# Patient Record
Sex: Female | Born: 1980 | Race: Black or African American | Hispanic: No | Marital: Married | Smoking: Former smoker
Health system: Southern US, Community
[De-identification: ages and names within clinical notes are randomized; demographics above are authoritative.]

## PROBLEM LIST (undated history)

## (undated) DIAGNOSIS — E669 Obesity, unspecified: Secondary | ICD-10-CM

## (undated) DIAGNOSIS — D649 Anemia, unspecified: Secondary | ICD-10-CM

## (undated) DIAGNOSIS — F419 Anxiety disorder, unspecified: Secondary | ICD-10-CM

## (undated) DIAGNOSIS — R0602 Shortness of breath: Secondary | ICD-10-CM

## (undated) DIAGNOSIS — I10 Essential (primary) hypertension: Secondary | ICD-10-CM

## (undated) DIAGNOSIS — Z9289 Personal history of other medical treatment: Secondary | ICD-10-CM

## (undated) DIAGNOSIS — E039 Hypothyroidism, unspecified: Secondary | ICD-10-CM

## (undated) HISTORY — DX: Hypothyroidism, unspecified: E03.9

## (undated) HISTORY — DX: Essential (primary) hypertension: I10

## (undated) HISTORY — DX: Obesity, unspecified: E66.9

---

## 2000-11-07 ENCOUNTER — Other Ambulatory Visit: Admission: RE | Admit: 2000-11-07 | Discharge: 2000-11-07 | Payer: Self-pay | Admitting: Obstetrics

## 2001-02-23 ENCOUNTER — Inpatient Hospital Stay (HOSPITAL_COMMUNITY): Admission: AD | Admit: 2001-02-23 | Discharge: 2001-02-26 | Payer: Self-pay | Admitting: Obstetrics

## 2002-02-20 ENCOUNTER — Inpatient Hospital Stay (HOSPITAL_COMMUNITY): Admission: AD | Admit: 2002-02-20 | Discharge: 2002-02-23 | Payer: Self-pay | Admitting: Obstetrics

## 2003-06-13 ENCOUNTER — Inpatient Hospital Stay (HOSPITAL_COMMUNITY): Admission: AD | Admit: 2003-06-13 | Discharge: 2003-06-13 | Payer: Self-pay | Admitting: Obstetrics

## 2003-08-29 HISTORY — PX: TUBAL LIGATION: SHX77

## 2003-09-15 ENCOUNTER — Inpatient Hospital Stay (HOSPITAL_COMMUNITY): Admission: RE | Admit: 2003-09-15 | Discharge: 2003-09-18 | Payer: Self-pay | Admitting: Obstetrics

## 2003-09-15 ENCOUNTER — Encounter (INDEPENDENT_AMBULATORY_CARE_PROVIDER_SITE_OTHER): Payer: Self-pay | Admitting: Specialist

## 2004-12-25 ENCOUNTER — Inpatient Hospital Stay (HOSPITAL_COMMUNITY): Admission: EM | Admit: 2004-12-25 | Discharge: 2004-12-26 | Payer: Self-pay | Admitting: Emergency Medicine

## 2006-08-10 ENCOUNTER — Emergency Department (HOSPITAL_COMMUNITY): Admission: EM | Admit: 2006-08-10 | Discharge: 2006-08-10 | Payer: Self-pay | Admitting: Emergency Medicine

## 2007-05-05 ENCOUNTER — Emergency Department (HOSPITAL_COMMUNITY): Admission: EM | Admit: 2007-05-05 | Discharge: 2007-05-05 | Payer: Self-pay | Admitting: Emergency Medicine

## 2008-01-01 ENCOUNTER — Inpatient Hospital Stay (HOSPITAL_COMMUNITY): Admission: AD | Admit: 2008-01-01 | Discharge: 2008-01-01 | Payer: Self-pay | Admitting: Family Medicine

## 2008-01-17 ENCOUNTER — Encounter (INDEPENDENT_AMBULATORY_CARE_PROVIDER_SITE_OTHER): Payer: Self-pay | Admitting: Family Medicine

## 2008-01-17 ENCOUNTER — Ambulatory Visit: Payer: Self-pay | Admitting: Family Medicine

## 2008-01-17 DIAGNOSIS — E669 Obesity, unspecified: Secondary | ICD-10-CM | POA: Insufficient documentation

## 2008-01-17 LAB — CONVERTED CEMR LAB
ALT: 14 units/L (ref 0–35)
Albumin: 4.3 g/dL (ref 3.5–5.2)
CO2: 26 meq/L (ref 19–32)
Calcium: 9.4 mg/dL (ref 8.4–10.5)
Chloride: 103 meq/L (ref 96–112)
Cholesterol: 211 mg/dL — ABNORMAL HIGH (ref 0–200)
Creatinine, Ser: 0.92 mg/dL (ref 0.40–1.20)
GC Probe Amp, Genital: NEGATIVE
HCV Ab: NEGATIVE
Sodium: 138 meq/L (ref 135–145)
Total Protein: 7.5 g/dL (ref 6.0–8.3)

## 2008-01-21 ENCOUNTER — Encounter (INDEPENDENT_AMBULATORY_CARE_PROVIDER_SITE_OTHER): Payer: Self-pay | Admitting: Family Medicine

## 2008-01-27 ENCOUNTER — Encounter (INDEPENDENT_AMBULATORY_CARE_PROVIDER_SITE_OTHER): Payer: Self-pay | Admitting: Family Medicine

## 2008-02-17 ENCOUNTER — Ambulatory Visit: Payer: Self-pay | Admitting: Sports Medicine

## 2008-02-26 ENCOUNTER — Telehealth: Payer: Self-pay | Admitting: *Deleted

## 2008-02-27 ENCOUNTER — Encounter: Payer: Self-pay | Admitting: *Deleted

## 2008-03-19 ENCOUNTER — Ambulatory Visit: Payer: Self-pay | Admitting: Sports Medicine

## 2008-03-24 ENCOUNTER — Encounter: Payer: Self-pay | Admitting: Family Medicine

## 2008-03-24 ENCOUNTER — Ambulatory Visit: Payer: Self-pay | Admitting: Family Medicine

## 2008-03-24 LAB — CONVERTED CEMR LAB
Beta hcg, urine, semiquantitative: NEGATIVE
Ferritin: 1 ng/mL — ABNORMAL LOW (ref 10–291)
Hemoglobin: 7.5 g/dL
Platelets: 375 10*3/uL (ref 150–400)
RDW: 14.1 % (ref 11.5–15.5)

## 2008-03-25 ENCOUNTER — Ambulatory Visit (HOSPITAL_COMMUNITY): Admission: RE | Admit: 2008-03-25 | Discharge: 2008-03-25 | Payer: Self-pay | Admitting: Family Medicine

## 2008-03-25 ENCOUNTER — Telehealth: Payer: Self-pay | Admitting: *Deleted

## 2008-03-26 ENCOUNTER — Encounter: Payer: Self-pay | Admitting: Family Medicine

## 2008-03-27 ENCOUNTER — Encounter: Payer: Self-pay | Admitting: Family Medicine

## 2008-03-27 LAB — CONVERTED CEMR LAB: Free T4: 0.97 ng/dL (ref 0.89–1.80)

## 2008-04-03 ENCOUNTER — Encounter: Payer: Self-pay | Admitting: Family Medicine

## 2008-04-08 ENCOUNTER — Telehealth: Payer: Self-pay | Admitting: *Deleted

## 2008-04-23 ENCOUNTER — Ambulatory Visit: Payer: Self-pay | Admitting: Family Medicine

## 2008-04-23 DIAGNOSIS — E039 Hypothyroidism, unspecified: Secondary | ICD-10-CM | POA: Insufficient documentation

## 2008-06-18 ENCOUNTER — Encounter: Payer: Self-pay | Admitting: Family Medicine

## 2008-07-01 ENCOUNTER — Ambulatory Visit: Payer: Self-pay | Admitting: Family Medicine

## 2008-07-01 ENCOUNTER — Encounter: Payer: Self-pay | Admitting: Family Medicine

## 2008-07-01 LAB — CONVERTED CEMR LAB
Calcium: 9.4 mg/dL (ref 8.4–10.5)
Creatinine, Ser: 0.82 mg/dL (ref 0.40–1.20)
Glucose, Bld: 83 mg/dL (ref 70–99)
HCT: 35.8 % — ABNORMAL LOW (ref 36.0–46.0)
MCHC: 31.3 g/dL (ref 30.0–36.0)
MCV: 81.4 fL (ref 78.0–100.0)
Platelets: 387 10*3/uL (ref 150–400)
RBC: 4.4 M/uL (ref 3.87–5.11)
Sodium: 137 meq/L (ref 135–145)
TSH: 8.524 microintl units/mL — ABNORMAL HIGH (ref 0.350–4.50)

## 2008-08-10 ENCOUNTER — Ambulatory Visit: Payer: Self-pay | Admitting: Family Medicine

## 2008-08-10 ENCOUNTER — Encounter: Payer: Self-pay | Admitting: Family Medicine

## 2008-08-10 DIAGNOSIS — N938 Other specified abnormal uterine and vaginal bleeding: Secondary | ICD-10-CM | POA: Insufficient documentation

## 2008-08-10 DIAGNOSIS — N949 Unspecified condition associated with female genital organs and menstrual cycle: Secondary | ICD-10-CM

## 2008-08-10 DIAGNOSIS — I10 Essential (primary) hypertension: Secondary | ICD-10-CM | POA: Insufficient documentation

## 2008-08-10 LAB — CONVERTED CEMR LAB: TSH: 4.936 microintl units/mL — ABNORMAL HIGH (ref 0.350–4.50)

## 2008-09-17 ENCOUNTER — Encounter: Payer: Self-pay | Admitting: Family Medicine

## 2008-09-17 ENCOUNTER — Ambulatory Visit (HOSPITAL_COMMUNITY): Admission: RE | Admit: 2008-09-17 | Discharge: 2008-09-17 | Payer: Self-pay | Admitting: Family Medicine

## 2008-09-17 ENCOUNTER — Ambulatory Visit: Payer: Self-pay | Admitting: Family Medicine

## 2008-09-17 LAB — CONVERTED CEMR LAB
BUN: 10 mg/dL (ref 6–23)
CO2: 24 meq/L (ref 19–32)
Calcium: 9.3 mg/dL (ref 8.4–10.5)
Creatinine, Ser: 0.73 mg/dL (ref 0.40–1.20)

## 2008-09-18 ENCOUNTER — Telehealth: Payer: Self-pay | Admitting: Family Medicine

## 2008-09-25 ENCOUNTER — Encounter: Payer: Self-pay | Admitting: Family Medicine

## 2008-10-05 ENCOUNTER — Ambulatory Visit: Payer: Self-pay | Admitting: Family Medicine

## 2008-10-07 ENCOUNTER — Ambulatory Visit: Payer: Self-pay | Admitting: Family Medicine

## 2008-10-28 ENCOUNTER — Emergency Department (HOSPITAL_COMMUNITY): Admission: EM | Admit: 2008-10-28 | Discharge: 2008-10-28 | Payer: Self-pay | Admitting: Emergency Medicine

## 2008-11-10 ENCOUNTER — Encounter: Payer: Self-pay | Admitting: Family Medicine

## 2008-11-12 ENCOUNTER — Encounter: Admission: RE | Admit: 2008-11-12 | Discharge: 2008-12-16 | Payer: Self-pay | Admitting: Orthopedic Surgery

## 2009-01-13 IMAGING — US US TRANSVAGINAL NON-OB
1 series · 14 of 25 positions shown · non-contrast
Comparison: None

CLINICAL DATA: Uterine bleeding for 3 weeks

TRANSVAGINAL ULTRASOUND OF PELVIS,ULTRASOUND PELVIS COMPLETE -
MODIFY
TECHNIQUE: Transvaginal ultrasound examination of the pelvis was
performed including evaluation of the uterus, ovaries, adnexal
regions, and pelvic cul-de-sac,

[Series 1: us transvaginal non-ob · 0.24mm/px · 14 of 46 slices shown]
[im 1/46]
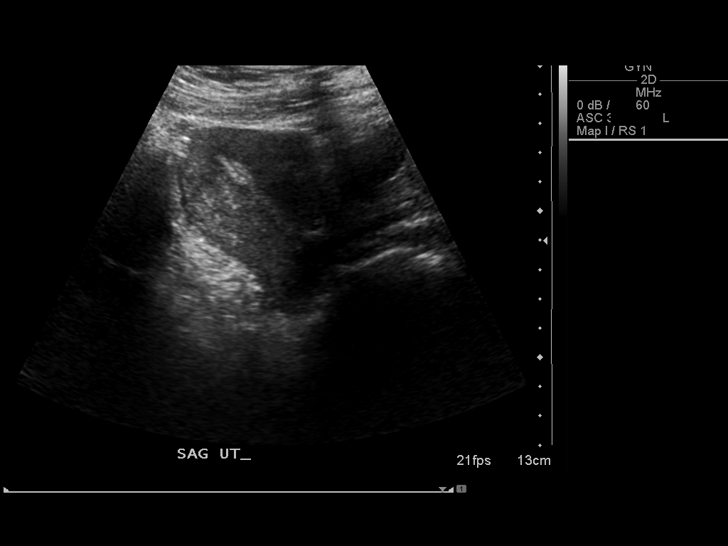
[im 4/46]
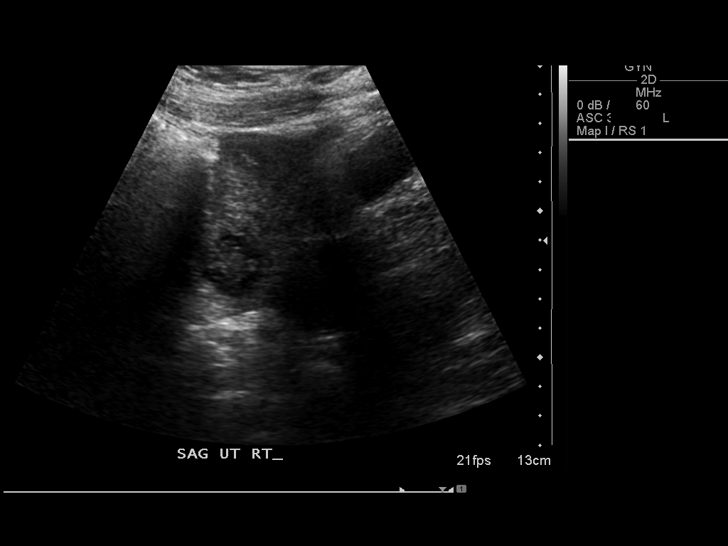
[im 8/46]
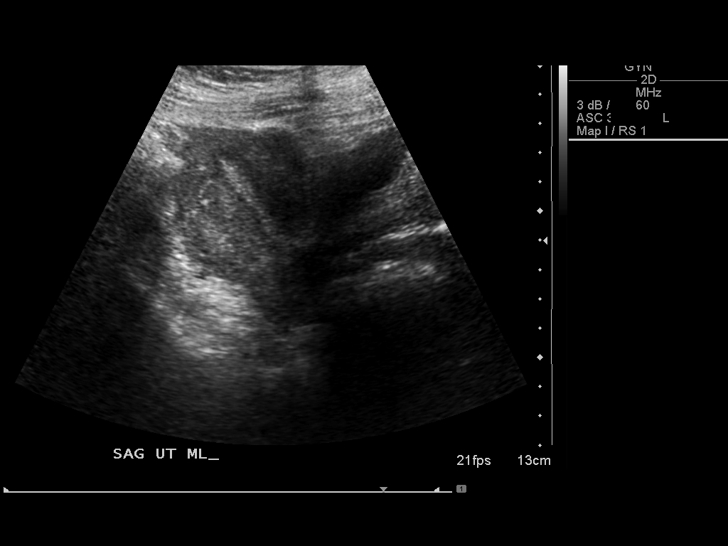
[im 12/46]
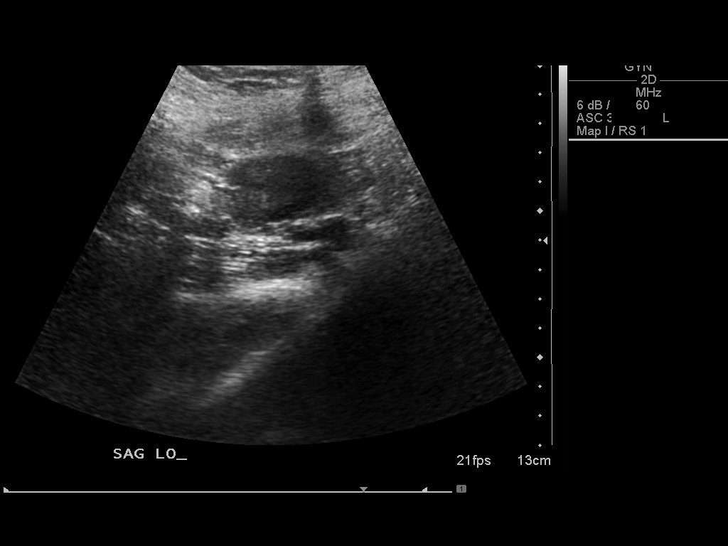
[im 16/46]
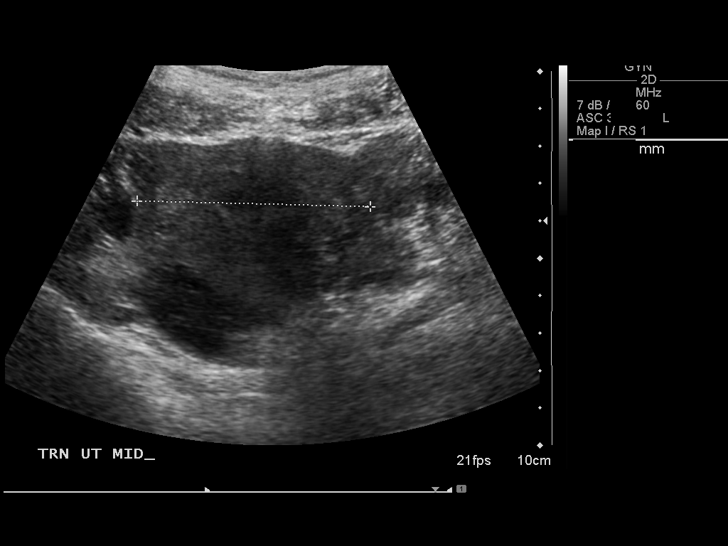
[im 17/46]
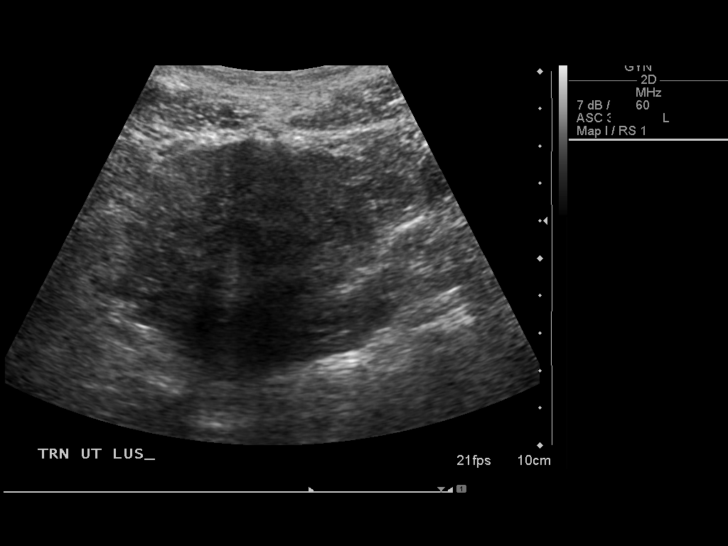
[im 21/46]
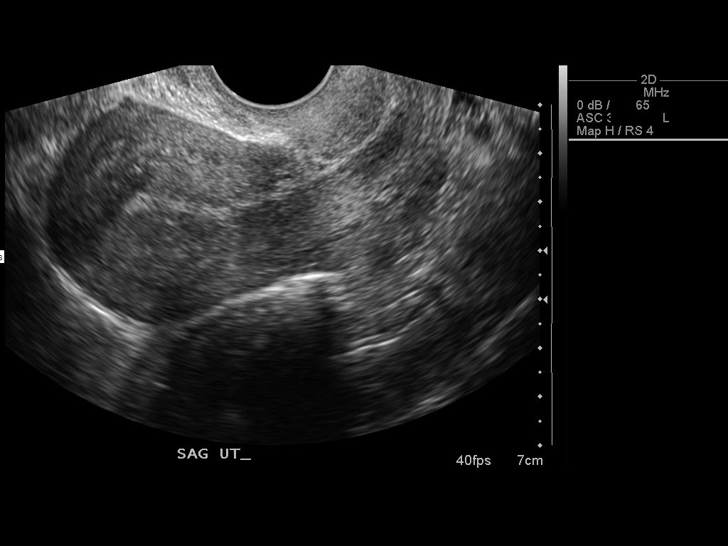
[im 25/46]
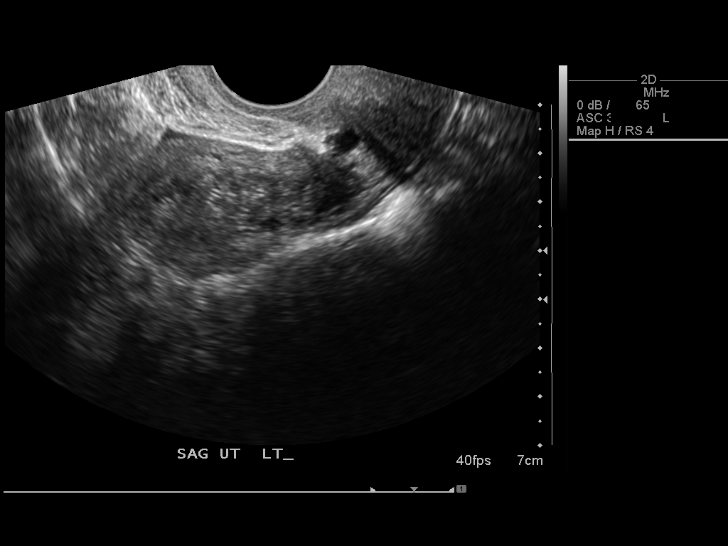
[im 29/46]
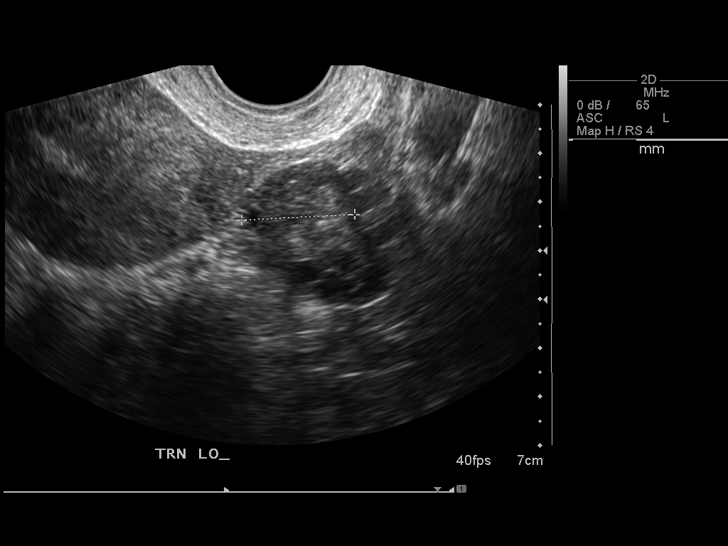
[im 31/46]
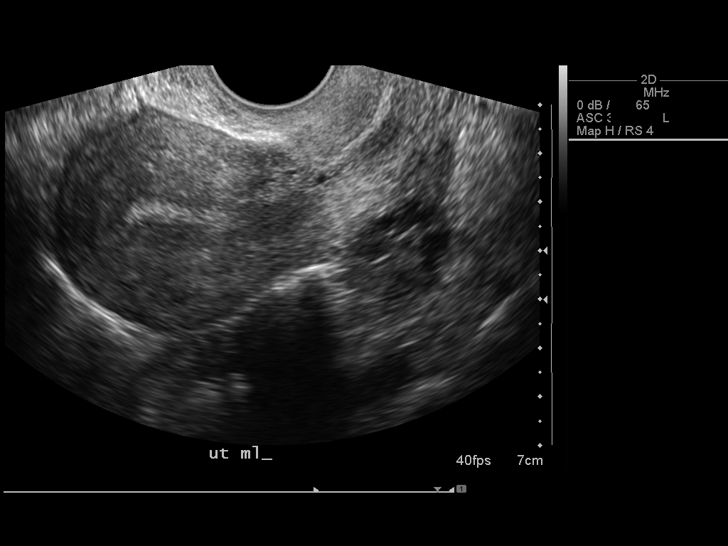
[im 34/46]
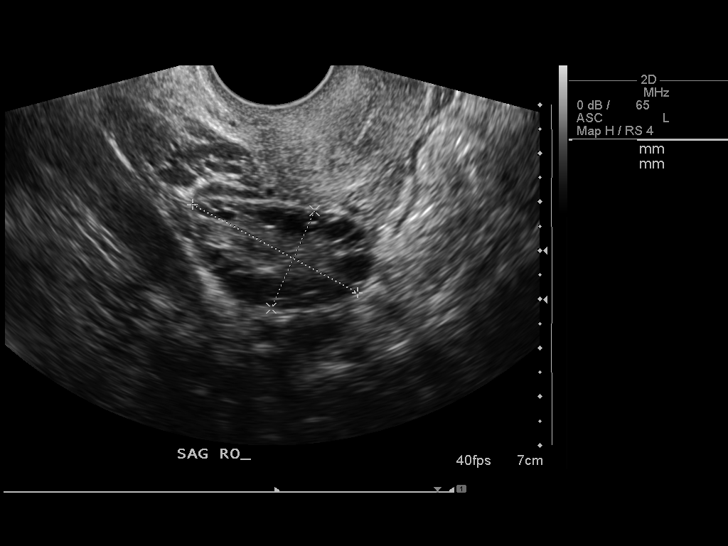
[im 38/46]
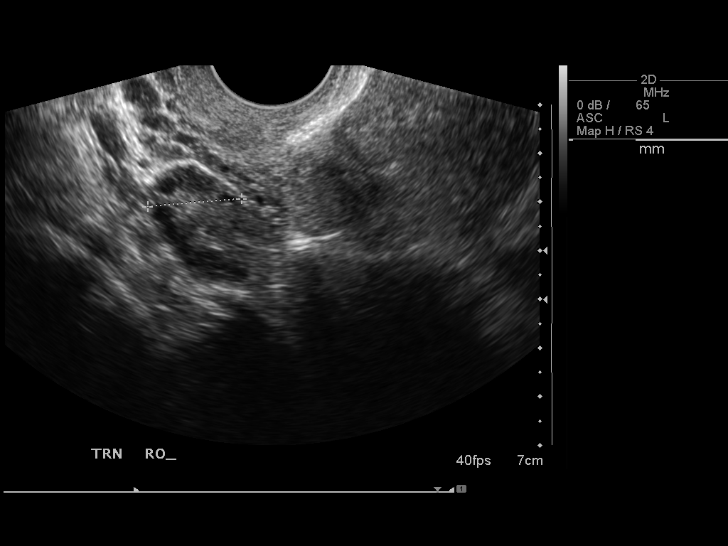
[im 42/46]
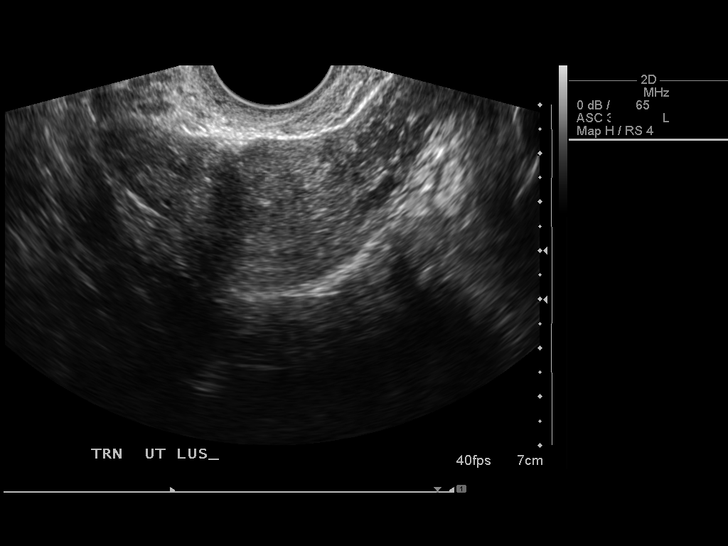
[im 46/46]
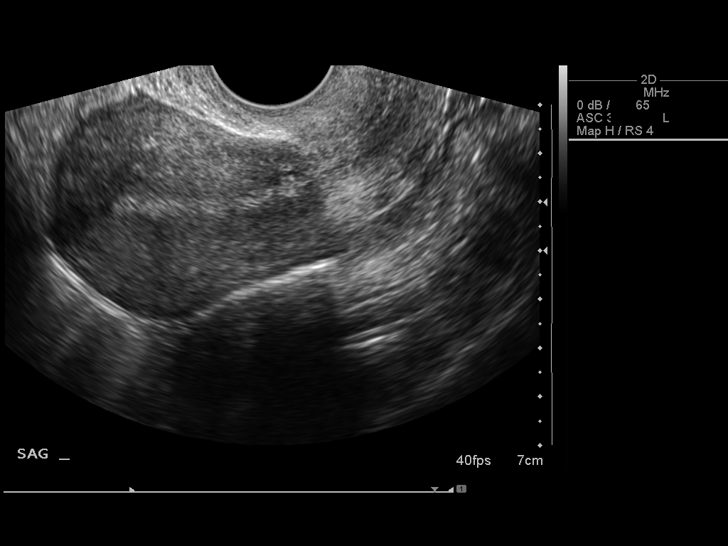

[14 of 25 positions shown; findings below may reference images not displayed]

FINDINGS: The uterus is normal in size and and is homogeneous in
echotexture, measuring 7.5 cm in length by 4.7 cm AP diameter by
6.2 cm in width.  No focal uterine mass is identified.  The
endometrium is 4 mm in thickness.

Both ovaries are normal in size in appearance measuring 3.8 x 2.2 x
1.9 cm on the right and 3.5 x 2.3 x 2.3 cm on the left.  There are
multiple small follicles in both ovaries.  No ovarian cyst or mass
is identified.  There is no free fluid in the pelvis.
IMPRESSION: The uterus and ovaries are within normal limits.  The endometrial
stripe is 4 mm in thickness.

## 2009-02-16 ENCOUNTER — Telehealth: Payer: Self-pay | Admitting: Family Medicine

## 2009-02-16 ENCOUNTER — Ambulatory Visit: Payer: Self-pay | Admitting: Family Medicine

## 2009-05-27 ENCOUNTER — Emergency Department (HOSPITAL_COMMUNITY): Admission: EM | Admit: 2009-05-27 | Discharge: 2009-05-27 | Payer: Self-pay | Admitting: Emergency Medicine

## 2009-06-07 ENCOUNTER — Encounter: Payer: Self-pay | Admitting: Family Medicine

## 2009-06-07 ENCOUNTER — Ambulatory Visit: Payer: Self-pay | Admitting: Family Medicine

## 2009-06-07 ENCOUNTER — Other Ambulatory Visit: Admission: RE | Admit: 2009-06-07 | Discharge: 2009-06-07 | Payer: Self-pay | Admitting: Family Medicine

## 2009-06-07 LAB — CONVERTED CEMR LAB
BUN: 10 mg/dL (ref 6–23)
Calcium: 8.8 mg/dL (ref 8.4–10.5)
Chlamydia, DNA Probe: NEGATIVE
Chloride: 104 meq/L (ref 96–112)
Creatinine, Ser: 1.08 mg/dL (ref 0.40–1.20)
GC Probe Amp, Genital: NEGATIVE
TSH: 9.433 microintl units/mL — ABNORMAL HIGH (ref 0.350–4.500)

## 2009-06-08 ENCOUNTER — Encounter: Payer: Self-pay | Admitting: Family Medicine

## 2009-06-09 ENCOUNTER — Telehealth: Payer: Self-pay | Admitting: Family Medicine

## 2009-06-22 ENCOUNTER — Emergency Department (HOSPITAL_COMMUNITY): Admission: EM | Admit: 2009-06-22 | Discharge: 2009-06-22 | Payer: Self-pay | Admitting: Emergency Medicine

## 2009-06-22 ENCOUNTER — Emergency Department (HOSPITAL_COMMUNITY): Admission: EM | Admit: 2009-06-22 | Discharge: 2009-06-22 | Payer: Self-pay | Admitting: Family Medicine

## 2009-06-22 ENCOUNTER — Encounter: Payer: Self-pay | Admitting: Family Medicine

## 2009-07-06 ENCOUNTER — Encounter: Payer: Self-pay | Admitting: Family Medicine

## 2009-07-06 ENCOUNTER — Encounter: Admission: RE | Admit: 2009-07-06 | Discharge: 2009-08-25 | Payer: Self-pay | Admitting: Family Medicine

## 2009-07-07 ENCOUNTER — Ambulatory Visit: Payer: Self-pay | Admitting: Family Medicine

## 2009-07-30 ENCOUNTER — Encounter: Payer: Self-pay | Admitting: Family Medicine

## 2009-07-30 ENCOUNTER — Ambulatory Visit: Payer: Self-pay | Admitting: Family Medicine

## 2009-08-02 LAB — CONVERTED CEMR LAB: TSH: 3.626 microintl units/mL (ref 0.350–4.500)

## 2009-10-06 ENCOUNTER — Ambulatory Visit: Payer: Self-pay | Admitting: Family Medicine

## 2009-10-06 ENCOUNTER — Telehealth: Payer: Self-pay | Admitting: Family Medicine

## 2009-11-01 ENCOUNTER — Ambulatory Visit: Payer: Self-pay | Admitting: Family Medicine

## 2009-11-03 ENCOUNTER — Ambulatory Visit: Payer: Self-pay | Admitting: Family Medicine

## 2009-11-03 ENCOUNTER — Encounter: Payer: Self-pay | Admitting: Family Medicine

## 2009-11-10 IMAGING — CR DG FINGER MIDDLE 2+V*L*
3 series · 3 of 3 positions shown · non-contrast
Comparison: None.

CLINICAL DATA: Slammed in door.

LEFT MIDDLE FINGER 2+V

[x finger pa left]
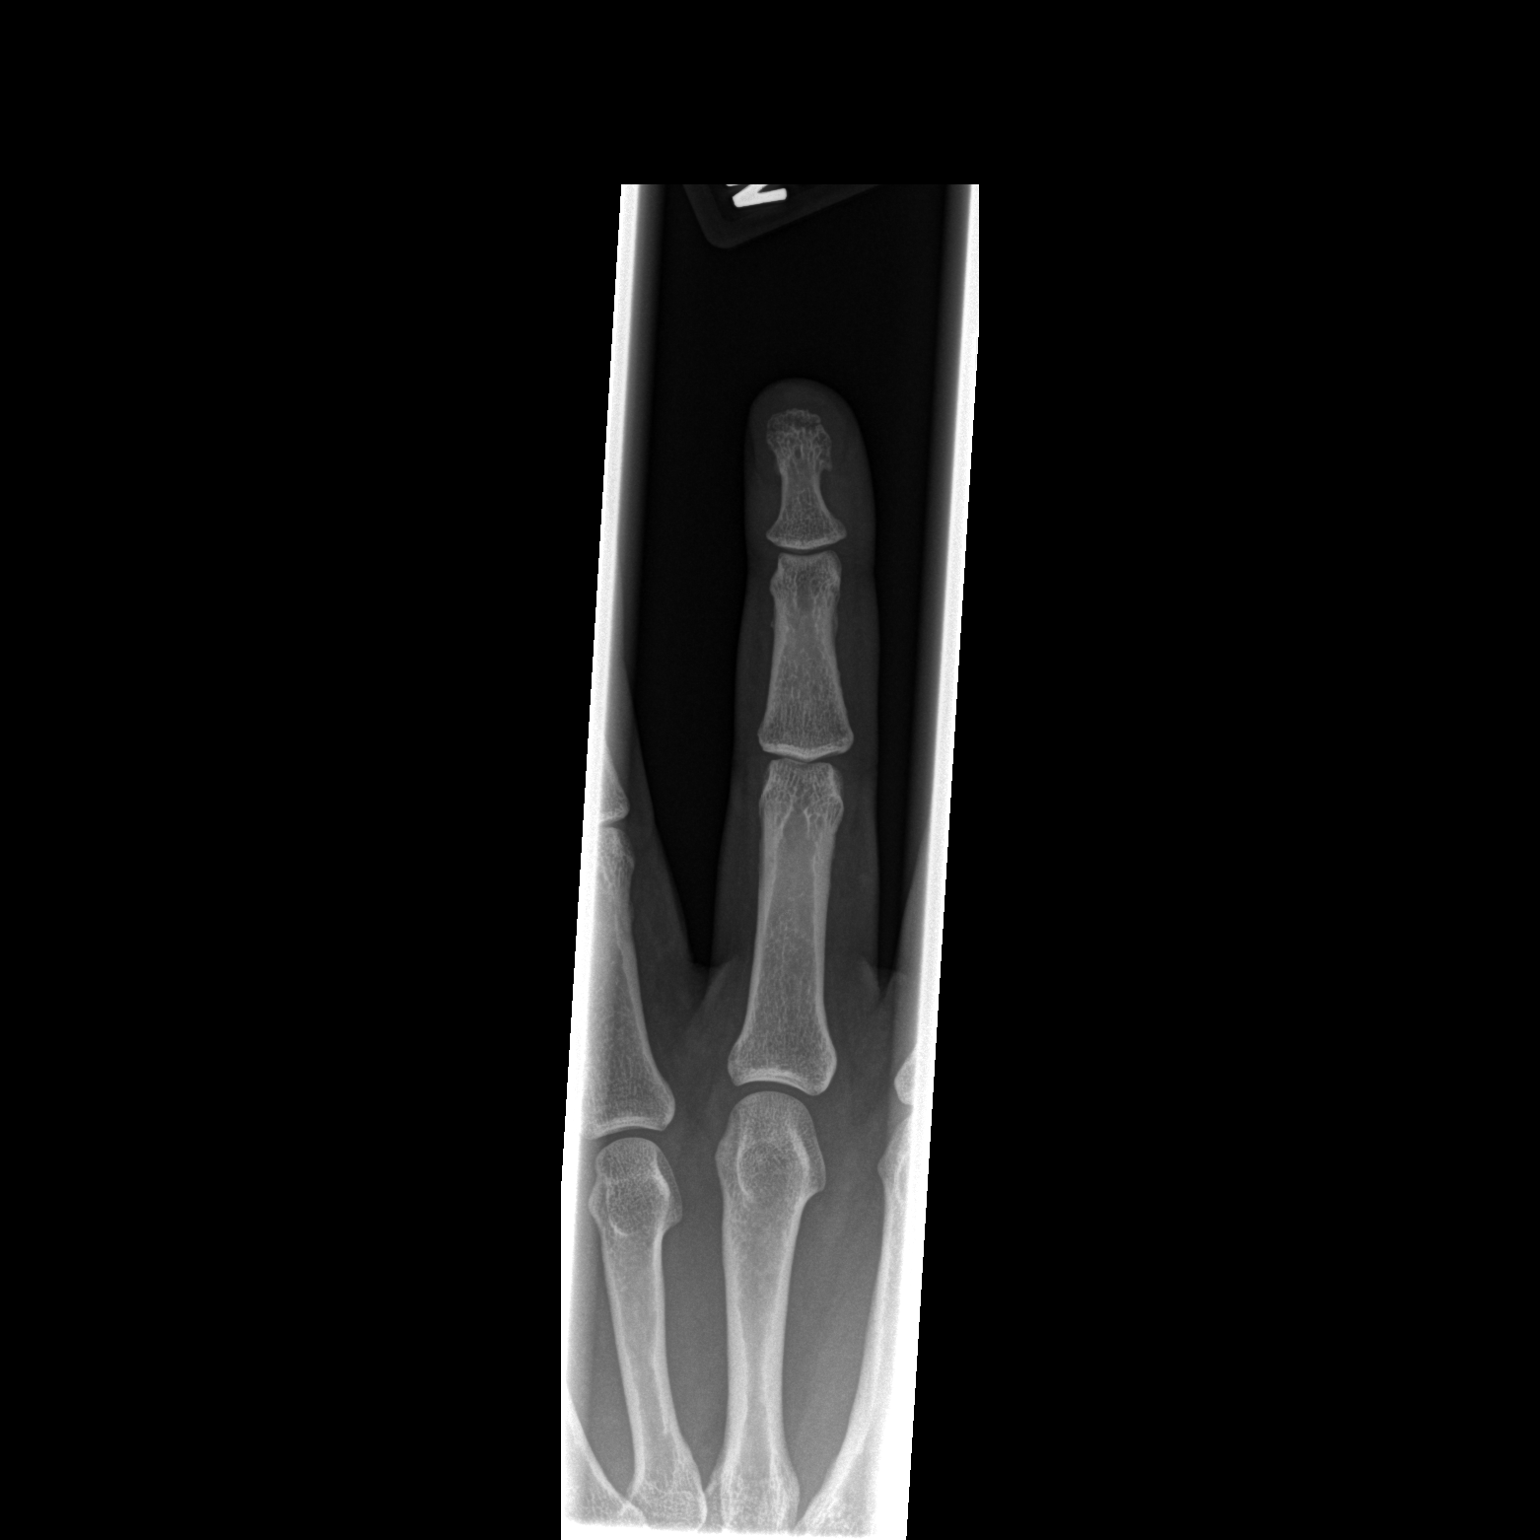

[x finger obl. left]
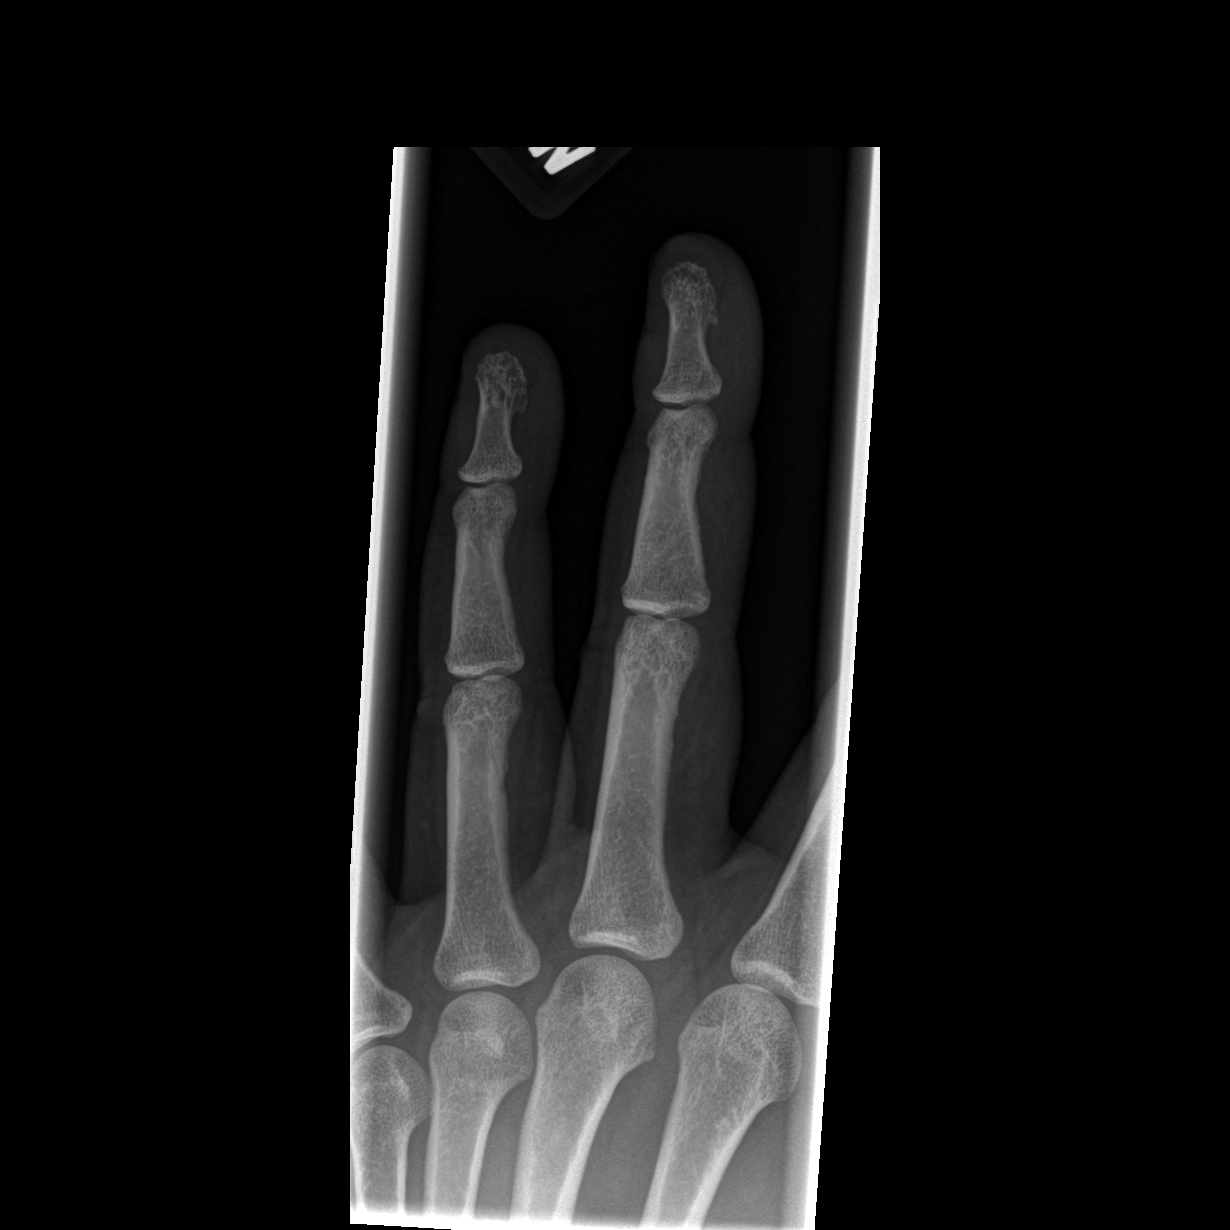

[x finger lateral left]
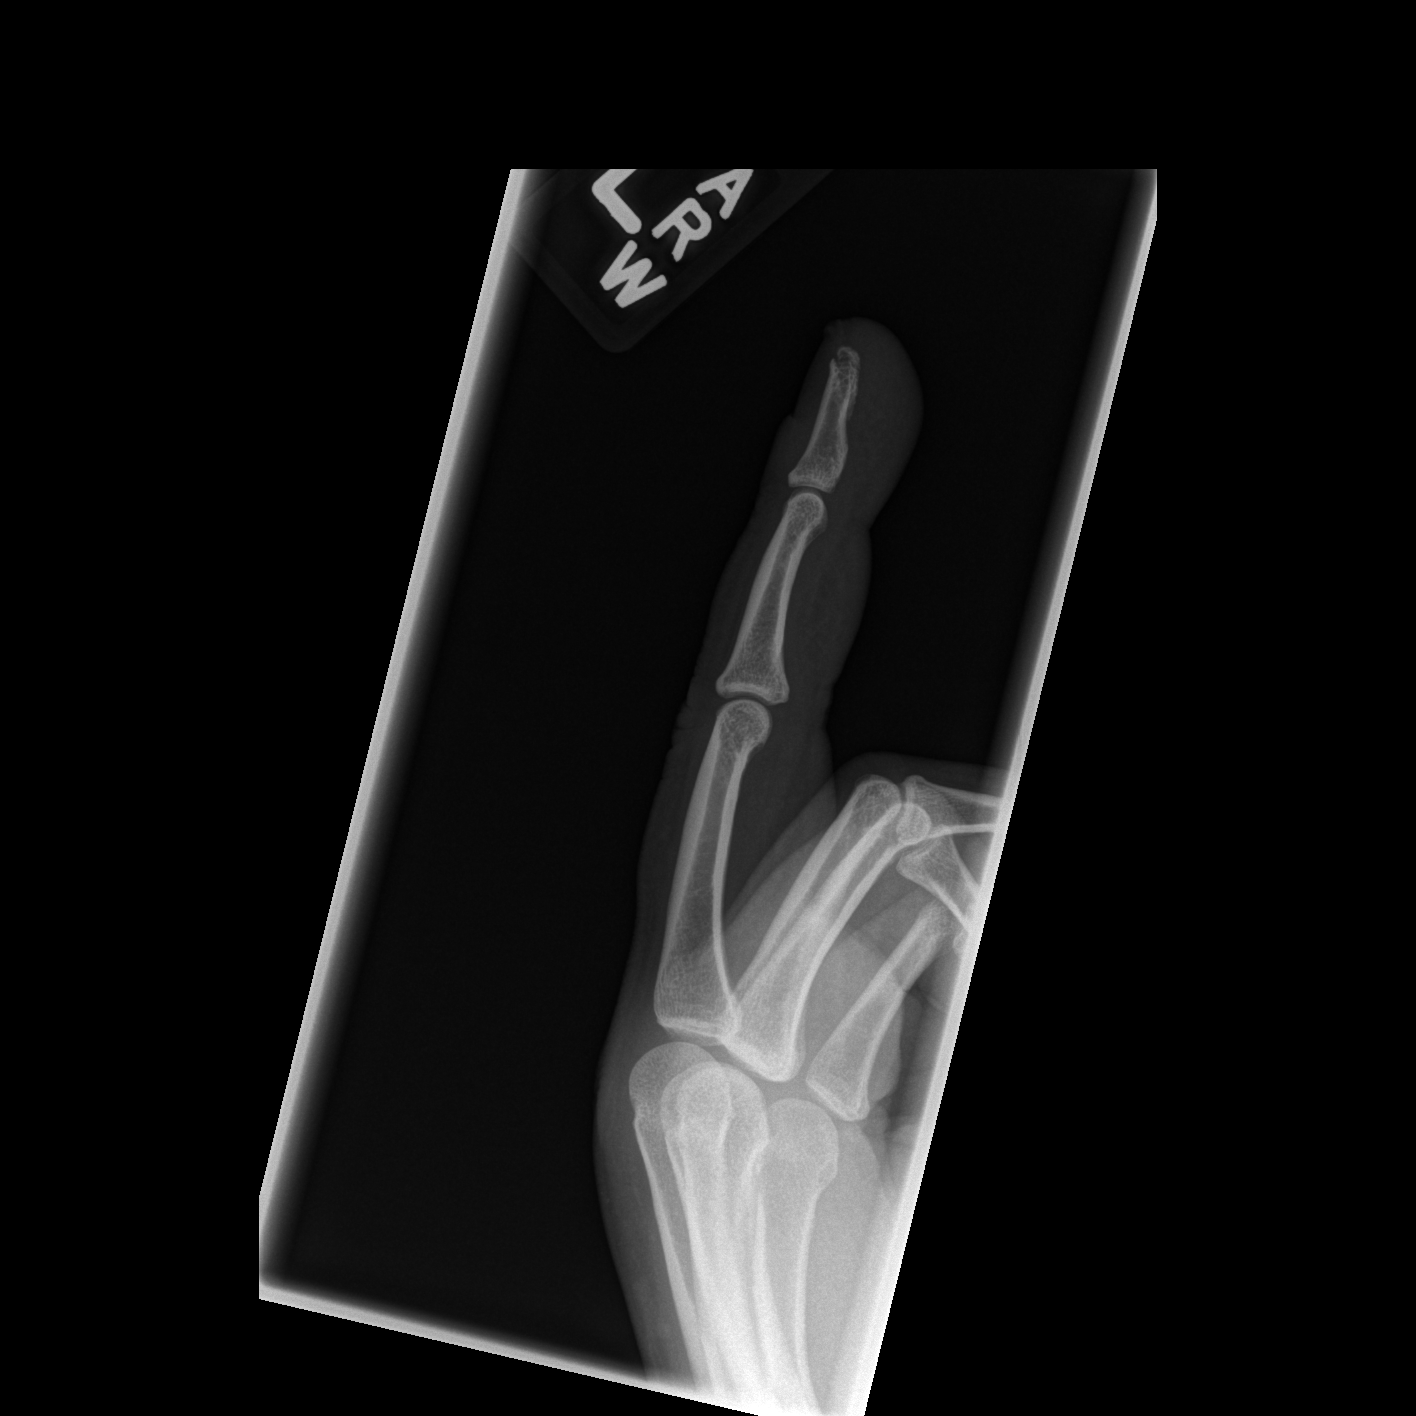

[3 of 3 positions shown; findings below may reference images not displayed]

FINDINGS: Minimally displaced fracture of the left middle finger
distal phalangeal tuft.
IMPRESSION: Fracture tuft of the left middle finger distal phalanx.

## 2009-11-19 ENCOUNTER — Ambulatory Visit: Payer: Self-pay | Admitting: Family Medicine

## 2010-03-15 ENCOUNTER — Ambulatory Visit: Payer: Self-pay | Admitting: Family Medicine

## 2010-03-15 ENCOUNTER — Encounter: Payer: Self-pay | Admitting: Family Medicine

## 2010-03-15 DIAGNOSIS — K5900 Constipation, unspecified: Secondary | ICD-10-CM | POA: Insufficient documentation

## 2010-03-16 ENCOUNTER — Encounter: Payer: Self-pay | Admitting: Family Medicine

## 2010-05-27 ENCOUNTER — Encounter: Payer: Self-pay | Admitting: Family Medicine

## 2010-08-28 DIAGNOSIS — F419 Anxiety disorder, unspecified: Secondary | ICD-10-CM

## 2010-08-28 HISTORY — DX: Anxiety disorder, unspecified: F41.9

## 2010-09-27 NOTE — Assessment & Plan Note (Signed)
Summary: cough,n&v, sore throat/[place]/Ta   Vital Signs:  Patient profile:   30 year old female Height:      67 inches Weight:      204.6 pounds BMI:     32.16 Temp:     98.6 degrees F oral Pulse rate:   86 / minute BP sitting:   138 / 89  (right arm) Cuff size:   regular  Vitals Entered By: Gladstone Pih (October 06, 2009 1:41 PM) CC: C/O cough,N/V, thraot sore X 2 days Is Patient Diabetic? No Pain Assessment Patient in pain? no        Primary Care Provider:  CAT TA MD  CC:  C/O cough, N/V, and thraot sore X 2 days.  History of Present Illness: 1.  cough, n/v--coughing and sore throat for past 2 days.  also headache.  also had 2 episodes of emesis after eating.  no fever, nasal congestion, diarrhea.  children sick with similar symptoms.  symptoms worse at night.  took nyquil with minimal relief.    Habits & Providers  Alcohol-Tobacco-Diet     Tobacco Status: never  Current Medications (verified): 1)  Lisinopril-Hydrochlorothiazide 20-25 Mg Tabs (Lisinopril-Hydrochlorothiazide) .Marland Kitchen.. 1 Tab By Mouth Daily For High Blood Pressure 2)  Levothroid 137 Mcg Tabs (Levothyroxine Sodium) .Marland Kitchen.. 1 Tab By Mouth Daily For Thyroid.  Take On An Empty Stomach.  Make Appt To Have Thyroid Checked in March 2011. 3)  Loratadine 10 Mg Tabs (Loratadine) .Marland Kitchen.. 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  no exudate Neck:  mild ant lad Lungs:  Normal respiratory effort, chest expands symmetrically. Lungs are clear to auscultation, no crackles or wheezes. Heart:  Normal rate and regular rhythm. S1 and S2 normal without gallop, murmur, click, rub  or other extra sounds. Additional Exam:  vital signs reviewed    Impression & Recommendations:  Problem # 1:  UPPER RESPIRATORY INFECTION (ICD-465.9) Assessment New  no red flags.  supportive care as below Her updated medication list for this problem includes:    Loratadine 10 Mg Tabs (Loratadine) .Marland Kitchen... 1 tab by mouth daily for allergies    Benzonatate 200 Mg Caps (Benzonatate) .Marland Kitchen... 1 tab by mouth three times a day as needed cough  Orders: [facility]- Est Level  3 ([address])  Complete Medication List: 1)  Lisinopril-hydrochlorothiazide 20-25 Mg Tabs (Lisinopril-hydrochlorothiazide) .Marland Kitchen.. 1 tab by mouth daily for high blood pressure 2)  Levothroid 137 Mcg Tabs (Levothyroxine sodium) .Marland Kitchen.. 1 tab by mouth daily for thyroid.  take on an empty stomach.  make appt to have thyroid checked in march 2011. 3)  Loratadine 10 Mg Tabs (Loratadine) .Marland Kitchen.. 1 tab by mouth daily for allergies 4)  Benzonatate 200 Mg Caps (Benzonatate) .Marland Kitchen.. 1 tab by mouth three times a day as needed cough  Patient Instructions: 1)  It was nice to see you today. 2)  For your cough, you can take the benzonatate. 3)  Drink plenty of fluids. 4)  If you get congested, you can take sudafed. 5)  You can use cloraseptic or cough drops for a sore throat. 6)  Tylenol or ibuprofen for fever or sore throat. 7)  Please schedule a follow-up appointment if your not  getting better. Prescriptions: BENZONATATE 200 MG CAPS (BENZONATATE) 1 tab by mouth three times a day as needed cough  #30 x 0   Entered and Authorized by:   Asher Muir MD   Signed by:   Asher Muir MD on 10/06/2009   Method used:   [facility] to        [facility] [phone]* (retail)       [address]       [place], [place]  [address]       Ph: [phone] or [phone]       Fax: [phone]   RxID:   [id number]

## 2010-09-27 NOTE — Assessment & Plan Note (Signed)
Summary: read tb/eo  Nurse Visit   Allergies: No Known Drug Allergies  PPD Results    Date of reading: 11/03/2009    Results: 0 mm    Interpretation: negative  Orders Added: 1)  No Charge Patient Arrived (NCPA0) [NCPA0]

## 2010-09-27 NOTE — Assessment & Plan Note (Signed)
Summary: female prob   Vital Signs:  Patient profile:   30 year old female Height:      67 inches Weight:      218.1 pounds BMI:     34.28 Temp:     98.5 degrees F oral Pulse rate:   99 / minute BP sitting:   133 / 85  (left arm) Cuff size:   regular  Vitals Entered By: Garen Grams LPN (May 27, 2010 11:18 AM) CC: menstrual cycle x 3 weeks Is Patient Diabetic? No Pain Assessment Patient in pain? no        Primary Care Provider:  CAT TA MD  CC:  menstrual cycle x 3 weeks.  History of Present Illness: 30 y/o F here for AUB x 3 wks.    AUB: She had BTL in 08/2003 and states that menses have been irregular since.  She has had this int the past (2 yrs ago) and work-up (tvus) was negative.  Now menses x 3 wks.  Changing pads q1hr because she does not like the way it feels.  .  No dyspaurenia.  No abd pain.  No dysuria.  No fever, chills.    BTL 08/2003.  Sexually active with one partner, her boyfriend, for yrs.    Habits & Providers  Alcohol-Tobacco-Diet     Tobacco Status: never     Tobacco Counseling: to remain off tobacco products  Current Medications (verified): 1)  Lisinopril-Hydrochlorothiazide 20-25 Mg Tabs (Lisinopril-Hydrochlorothiazide) .Marland Kitchen.. 1 Tab By Mouth Daily For High Blood Pressure 2)  Levothroid 150 Mcg Tabs (Levothyroxine Sodium) .Marland Kitchen.. 1 Tab By Mouth Daily. 3)  Colace 100 Mg Caps (Docusate Sodium) .Marland Kitchen.. 1 Cap By Mouth Two Times A Day For Constipation 4)  Provera 10 Mg Tabs (Medroxyprogesterone Acetate) .... 2 Tabs By Mouth Three Times A Day X 7 Days, Then 2 Tab Daily X 3 Wks.  Allergies (verified): No Known Drug Allergies  Review of Systems General:  Denies chills and fever. GU:  Complains of abnormal vaginal bleeding; denies discharge, dysuria, genital sores, hematuria, incontinence, and nocturia.  Physical Exam  General:  Well-developed,well-nourished,in no acute distress; alert,appropriate and cooperative throughout examination. vitals reviewed.    Genitalia:  Normal introitus for age, no external lesions, no vaginal discharge, mucosa pink and moist, no vaginal or cervical lesions, no vaginal atrophy, no friaility or hemorrhage, normal uterus size and position, no adnexal masses or tenderness. +bleeding.    Impression & Recommendations:  Problem # 1:  DYSFUNCTIONAL UTERINE BLEEDING (ICD-626.8) Assessment New PE wnl.  Work up TVUS 65yrs ago was negative.  Will treat with Provera 20mg .  If contineus to be problem, will repeat workup and may refer to GYN for options.   Orders: [facility]- Est Level  3 ([address]40)  Complete Medication List: 1)  Lisinopril-hydrochlorothiazide 20-25 Mg Tabs (Lisinopril-hydrochlorothiazide) .Marland Kitchen.. 1 tab by mouth daily for high blood pressure 2)  Levothroid 150 Mcg Tabs (Levothyroxine sodium) .Marland Kitchen.. 1 tab by mouth daily. 3)  Colace 100 Mg Caps (Docusate sodium) .Marland Kitchen.. 1 cap by mouth two times a day for constipation 4)  Provera 10 Mg Tabs (Medroxyprogesterone acetate) .... 2 tabs by mouth three times a day x 7 days, then 2 tab daily x 3 wks.  Patient Instructions: 1)  Please schedule a follow-up appointment if you continue to have irregular bleeding.  2)  To stop your bleeding you can take Premarin 20mg  three times a day x 7 days, then 20mg  daily x 21 days. 3)  If your  period continues to be irregular with heaving and prolonged bleeding we may get ultrasound again and then refer you to Gynecologist.   Prescriptions: PROVERA 10 MG TABS (MEDROXYPROGESTERONE ACETATE) 2 tabs by mouth three times a day x 7 days, then 2 tab daily x 3 wks.  #120 x 0   Entered and Authorized by:   Angeline Slim MD   Signed by:   Angeline Slim MD on 05/27/2010   Method used:   Electronically to        [facility] [address] (retail)       [phone]       [place], [place]  [address]       Ph: [phone]       Fax: [phone]   RxID:   [id number] PROVERA 10 MG TABS (MEDROXYPROGESTERONE ACETATE) 2 tabs by mouth  three times a day x 7 days, then one tab daily x 3 wks.  #90 x 0   Entered and Authorized by:   Angeline Slim MD   Signed by:   Angeline Slim MD on 05/27/2010   Method used:   [facility]        [facility] [phone]* (retail)       [address], [place]  [address]       Ph: [phone] or [phone]       Fax: [phone]   RxID:   [id number]    Prevention & Chronic Care Immunizations   Influenza vaccine: given  (09/17/2008)   Influenza vaccine due: 09/17/2009    Tetanus booster: 09/17/2008: given   Tetanus booster due: 09/17/2018    Pneumococcal vaccine: Not documented  Other Screening   Pap smear: NEGATIVE FOR INTRAEPITHELIAL LESIONS OR MALIGNANCY.  (06/07/2009)   Pap smear due: 01/26/2009   Smoking status: never  (05/27/2010)  Hypertension   Last Blood Pressure: 133 / 85  (05/27/2010)   Serum creatinine: 1.08  (06/07/2009)   BMP action: Ordered   Serum potassium 3.9  (06/07/2009)  Self-Management Support :   Personal Goals (by the next clinic visit) :      Personal blood pressure goal: 130/80  (06/07/2009)   Hypertension self-management support: Not documented

## 2010-09-27 NOTE — Letter (Signed)
Summary: Out of Work  [facility]  [address]   [place], [place] [address]   Phone: [phone]  Fax: [phone]    March 15, 2010   Employee:  NEAVEH BELANGER    To Whom It May Concern:   For Medical reasons, please excuse the above named employee from work for the following dates:  Start:   March 15, 2010  End:   May return to work on March 16, 2010  If you need additional information, please feel free to contact our office.         Sincerely,    Airik Goodlin MD

## 2010-09-27 NOTE — Assessment & Plan Note (Signed)
Summary: cold/congestion,df   Vital Signs:  Patient profile:   30 year old female Height:      67 inches Weight:      209 pounds BMI:     32.85 Temp:     98.3 degrees F oral Pulse rate:   93 / minute BP sitting:   130 / 97  (right arm) Cuff size:   large  Vitals Entered By: Tessie Fass CMA (November 19, 2009 11:19 AM) CC: cough and congestion x 4 days Is Patient Diabetic? No Pain Assessment Patient in pain? no        Primary Care Provider:  CAT TA MD  CC:  cough and congestion x 4 days.  History of Present Illness: Patient reports coughing and sneezing that began on Monday of this week, states by Wednesday increased nasal congestion with post nasal drip and fever of 100.1.  Reports some associated headache, no sore throat and denies chills or night sweats.  Has taken Alkaseltzer cold medicine, Mucinex and Nyquil with minimal relief of symptoms.  Has hx of seasonal allergies but no history of lung problems, denies sob.  Habits & Providers  Alcohol-Tobacco-Diet     Tobacco Status: never  Current Medications (verified): 1)  Lisinopril-Hydrochlorothiazide 20-25 Mg Tabs (Lisinopril-Hydrochlorothiazide) .Marland Kitchen.. 1 Tab By Mouth Daily For High Blood Pressure 2)  Levothroid 137 Mcg Tabs (Levothyroxine Sodium) .Marland Kitchen.. 1 Tab By Mouth Daily For Thyroid.  Take On An Empty Stomach.  Make Appt To Have Thyroid Checked in March 2011. 3)  Loratadine 10 Mg Tabs (Loratadine) .Marland Kitchen.. 1 Tab By Mouth Daily For Allergies 4)  Benzonatate 200 Mg Caps (Benzonatate) .Marland Kitchen.. 1 Tab By Mouth Three Times A Day As Needed Cough  Allergies (verified): No Known Drug Allergies  Review of Systems General:  Denies chills, fatigue, fever, and sweats. Eyes:  Denies discharge, eye irritation, eye pain, and itching. ENT:  Complains of nasal congestion and postnasal drainage; denies difficulty swallowing, ear discharge, earache, hoarseness, sinus pressure, and sore throat. CV:  Denies chest pain or discomfort, difficulty  breathing at night, lightheadness, and shortness of breath with exertion. Resp:  Complains of cough; denies chest pain with inspiration, shortness of breath, sputum productive, and wheezing. Derm:  Denies changes in color of skin and rash.  Physical Exam  General:  Well-developed,well-nourished,in no acute distress; alert,appropriate and cooperative throughout examination Eyes:  No corneal or conjunctival inflammation noted. EOMI. Perrla. Vision grossly normal. Ears:  External ear exam shows no significant lesions or deformities.  Otoscopic examination reveals clear canals, tympanic membranes are intact bilaterally without bulging, retraction, inflammation or discharge. Hearing is grossly normal bilaterally. Nose:  External nasal examination shows no deformity or inflammation. Nasal mucosa are pink and moist without lesions, small amount clear discharge. Mouth:  Oral mucosa and oropharynx without lesions or exudates.  Teeth in good repair. Lungs:  Normal respiratory effort, chest expands symmetrically. Lungs are clear to auscultation, no crackles or wheezes. Heart:  Normal rate and regular rhythm. S1 and S2 normal without gallop, murmur, click, rub or other extra sounds. Skin:  Intact without suspicious lesions or rashes Cervical Nodes:  No lymphadenopathy noted   Impression & Recommendations:  Problem # 1:  URI (ICD-465.9)  Four days of symptoms, likely URI, education on expectations and symptom relief measures. Return if symptoms persist or worsen. Her updated medication list for this problem includes:    Loratadine 10 Mg Tabs (Loratadine) .Marland Kitchen... 1 tab by mouth daily for allergies    Benzonatate 200 Mg  Caps (Benzonatate) .Marland Kitchen... 1 tab by mouth three times a day as needed cough  Orders: [facility]- Est Level  3 ([address])  Complete Medication List: 1)  Lisinopril-hydrochlorothiazide 20-25 Mg Tabs (Lisinopril-hydrochlorothiazide) .Marland Kitchen.. 1 tab by mouth daily for high blood pressure 2)  Levothroid 137  Mcg Tabs (Levothyroxine sodium) .Marland Kitchen.. 1 tab by mouth daily for thyroid.  take on an empty stomach.  make appt to have thyroid checked in march 2011. 3)  Loratadine 10 Mg Tabs (Loratadine) .Marland Kitchen.. 1 tab by mouth daily for allergies 4)  Benzonatate 200 Mg Caps (Benzonatate) .Marland Kitchen.. 1 tab by mouth three times a day as needed cough  Patient Instructions: 1)  This is likely a viral infection. 2)  Get plenty of rest and fluids. 3)  Avoid caffeine and milk products while you are sick. 4)  You may use Tylenol or Motrin and continue to take Mucinex for symptoms. 5)  Return if symptoms persist or worsens with treatment.

## 2010-09-27 NOTE — Letter (Signed)
Summary: Generic Letter  [facility]  [address]   [place], [place] [address]   Phone: [phone]  Fax: [phone]    03/16/2010  Amanda [name] [address] [place], [place]  [address]  Dear [name],  Your thyroid test yesterday was too high.  I increased the dose of your medicine.  I've sent it to your pharmacy.  Please come to clinic in 6 weeks to see me for repeat testing.           Sincerely,   Imre Vecchione MD  Appended Document: Generic Letter mailed

## 2010-09-27 NOTE — Assessment & Plan Note (Signed)
Summary: constipation,tcb   Vital Signs:  Patient profile:   30 year old Amanda [name] Height:      67 inches Weight:      211 pounds BMI:     33.17 Temp:     99.2 degrees F oral Pulse rate:   73 / minute BP sitting:   120 / 85  (left arm) Cuff size:   regular  Vitals Entered By: Jimmy Footman, CMA (March 15, 2010 4:20 PM) CC: constipation x 1 mth Is Patient Diabetic? No   Primary Care Provider:  Pablo Mathurin MD  CC:  constipation x 1 mth.  History of Present Illness: 30 y/o F for constipation   Constipation x 2-3 weeks.  Pt is having BM daily, but stools are firm and sometimes it is painful to have BM.  no stomach pain.  no fever chills.  no nausea, vomiting.  Menses monthly.  no weakness.   Typical diet: Breakfast: 1 boiled egg, OJ or bowl of cereal with 2% milk Lunch: Atkins protein drink or [place] ham sandwich Dinner: baked chicken (1 leg), greens (frozen green beans, corn) Snack: 1 fiber one bar Does not drink sodas     Habits & Providers  Alcohol-Tobacco-Diet     Tobacco Status: never     Tobacco Counseling: to remain off tobacco products  Current Medications (verified): 1)  Lisinopril-Hydrochlorothiazide 20-25 Mg Tabs (Lisinopril-Hydrochlorothiazide) .Marland Kitchen.. 1 Tab By Mouth Daily For High Blood Pressure 2)  Levothroid 137 Mcg Tabs (Levothyroxine Sodium) .Marland Kitchen.. 1 Tab By Mouth Daily For Thyroid.  Take On An Empty Stomach.  Make Appt To Have Thyroid Checked in March 2011. 3)  Colace 100 Mg Caps (Docusate Sodium) .Marland Kitchen.. 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  Unemplyed - watches her kids.  HS education.  no tob, etoh, drugs.  watches movies, goes out with friends for fun.  does not exercise.  wears seatbelt, doesn't wear sunscreen.  christian.  Risk Factors: Exercise: yes (04/23/2008)  Risk Factors: Smoking Status: never (03/15/2010)  Review of Systems General:  Complains of fatigue; denies chills, fever, loss of appetite, and weight loss. GI:  Complains of change in bowel habits, constipation, and gas; denies abdominal pain, bloody stools, dark tarry stools, diarrhea, indigestion, loss of appetite, nausea, and vomiting.  Physical Exam  General:  Well-developed,well-nourished,in no acute distress; alert,appropriate and cooperative throughout examination. vitals reviewed.   Neck:  No deformities, masses, or tenderness noted. Lungs:  Normal respiratory effort, chest expands symmetrically. Lungs are clear to auscultation, no crackles or wheezes. Abdomen:  Bowel sounds positive,abdomen soft and non-tender without masses, organomegaly or hernias noted. obese.  Extremities:  No clubbing, cyanosis, edema, or deformity noted with normal full range of motion of all joints.   Cervical Nodes:  No lymphadenopathy noted Inguinal Nodes:  No significant adenopathy   Impression & Recommendations:  Problem # 1:  CONSTIPATION (ICD-564.00) Symptoms not so much constipation, but firm stools.  Most likley due to lack of fiber in diet, as pt tries to eat a high protein less carb diet.  Advised increasing fiber.  Gave pt handout on fiber diet.  Will add colace to assist with soft bowel movements.  Will check TSH to assure that thyroid dysfunction is not cause of constipation.  Her updated medication list for this problem includes:    Colace 100 Mg Caps (Docusate sodium) .Marland Kitchen... 1 cap by mouth two times a day for constipation  Orders: [facility]- Est Level  3 ([address])  Problem # 2:  HYPOTHYROIDISM (ICD-244.9) Assessment: Comment Only Will check TSH today.     Her updated medication list for this problem includes:    Levothroid 137 Mcg Tabs (Levothyroxine sodium) .Marland Kitchen... 1 tab by mouth daily for thyroid.  take on an empty stomach.  make appt to have thyroid checked in march 2011.  Orders: TSH-[facility] ([address]11) [facility]- Est Level  3 ([address])  Complete Medication List: 1)  Lisinopril-hydrochlorothiazide 20-25 Mg Tabs (Lisinopril-hydrochlorothiazide) .Marland Kitchen.. 1 tab by mouth daily for high blood pressure 2)  Levothroid 137 Mcg Tabs (Levothyroxine sodium) .Marland Kitchen.. 1 tab by mouth daily for thyroid.  take on an empty stomach.  make appt to have thyroid checked in march 2011. 3)  Colace 100 Mg Caps (Docusate sodium) .Marland Kitchen.. 1 cap by mouth two times a day for constipation  Patient Instructions: 1)  Please schedule a follow-up appointment in 1 month.  2)  I'll call you if we need to change your thyroid medicine.  3)  We discussed increasing fiber in your diet.  Please try the handout I gave you. 4)  Continue all other medicine.   5)  Colace: stool softener two times a day  6)  Drink plenty of water daily.   Prescriptions: LISINOPRIL-HYDROCHLOROTHIAZIDE 20-25 MG TABS (LISINOPRIL-HYDROCHLOROTHIAZIDE) 1 tab by mouth daily for high blood pressure  #34 x 5   Entered and Authorized by:   Angeline Slim MD   Signed by:   Angeline Slim MD on 03/15/2010   Method used:   [facility] [phone]* (retail)       [address], [place]  [address]       Ph: [phone] or [phone]       Fax: [phone]   RxID:   [id number] COLACE 100 MG CAPS (DOCUSATE SODIUM) 1 cap by mouth two times a day for constipation  #60 x 1   Entered and Authorized by:   Angeline Slim MD   Signed by:   Angeline Slim MD on 03/15/2010   Method used:   [facility]        [facility] [phone]* (retail)       [address]       [place], [place]  [address]       Ph: [phone] or [phone]       Fax: [phone]   RxID:   [id number]

## 2010-09-27 NOTE — Progress Notes (Signed)
Summary: triage  Phone Note Call from Patient Call back at [phone]   Caller: Patient Summary of Call: nausea/vomiting - wants to come in today Initial call taken by: De Nurse,  October 06, 2009 8:40 AM  Follow-up for Phone Call        started last night. has a cough, sore throat. took nyquil. to see Dr. Lafonda Mosses at 1:45 Follow-up by: Golden Circle RN,  October 06, 2009 8:48 AM

## 2010-09-27 NOTE — Assessment & Plan Note (Signed)
Summary: tb test/kh  Nurse Visit   Allergies: No Known Drug Allergies  Immunizations Administered:  PPD Skin Test:    Vaccine Type: PPD    Site: right forearm    Mfr: Sanofi Pasteur    Dose: 0.1 ml    Route: ID    Given by: Theresia Lo RN    Exp. Date: 01/23/2012    Lot #: [id number]  Orders Added: 1)  TB Skin Test [86580] 2)  Admin 1st Vaccine [phone]

## 2010-09-30 NOTE — Miscellaneous (Signed)
Summary: Directives and Consents  Directives and Consents   Imported By: Abundio Miu 11/04/2009 13:25:55  _____________________________________________________________________  External Attachment:    Type:   Image     Comment:   External Document

## 2011-01-02 ENCOUNTER — Emergency Department (HOSPITAL_BASED_OUTPATIENT_CLINIC_OR_DEPARTMENT_OTHER)
Admission: EM | Admit: 2011-01-02 | Discharge: 2011-01-03 | Disposition: A | Discharge status: Home / Self Care | Payer: Medicaid Other | Attending: Emergency Medicine | Admitting: Emergency Medicine

## 2011-01-02 DIAGNOSIS — I10 Essential (primary) hypertension: Secondary | ICD-10-CM | POA: Insufficient documentation

## 2011-01-02 DIAGNOSIS — F410 Panic disorder [episodic paroxysmal anxiety] without agoraphobia: Secondary | ICD-10-CM | POA: Insufficient documentation

## 2011-01-02 DIAGNOSIS — E039 Hypothyroidism, unspecified: Secondary | ICD-10-CM | POA: Insufficient documentation

## 2011-01-02 DIAGNOSIS — R209 Unspecified disturbances of skin sensation: Secondary | ICD-10-CM | POA: Insufficient documentation

## 2011-01-04 ENCOUNTER — Encounter: Payer: Self-pay | Admitting: Family Medicine

## 2011-01-04 ENCOUNTER — Ambulatory Visit (INDEPENDENT_AMBULATORY_CARE_PROVIDER_SITE_OTHER): Payer: Self-pay | Admitting: Family Medicine

## 2011-01-04 VITALS — BP 121/77 | HR 106 | Temp 99.5°F | Ht 67.0 in | Wt 205.0 lb

## 2011-01-04 DIAGNOSIS — F410 Panic disorder [episodic paroxysmal anxiety] without agoraphobia: Secondary | ICD-10-CM

## 2011-01-04 DIAGNOSIS — E039 Hypothyroidism, unspecified: Secondary | ICD-10-CM

## 2011-01-04 LAB — TSH: TSH: 11.11 u[IU]/mL — ABNORMAL HIGH (ref 0.350–4.500)

## 2011-01-04 MED ORDER — LEVOTHYROXINE SODIUM 100 MCG PO TABS: 100.0000 ug | ORAL_TABLET | Freq: Every day | ORAL | Status: DC

## 2011-01-04 NOTE — Patient Instructions (Signed)
Please make follow up appointment in 4-5 weeks. I'll call you tomorrow with thyroid result.

## 2011-01-04 NOTE — Progress Notes (Signed)
  Subjective:    Patient ID: Amanda [name], female    DOB: [birth year], 30 y.o.   MRN: [id number]  HPI Pt is here for ER f/u of panic attack. Seen in ER 01/02/11 after pt was sweating and "passed" out per family/friends.  Was dx with panic attack.  She was not given any medications on discharge from ER, but was given Ativan in the ER. Pt does not have history of anxiety or panic attack.  She does not have psychiatric history.  She thinks that she may have recent stress from school, but no other adverse life events.  Anxiety: Patient complains of panic attacks.  She has the following symptoms: none. Onset of symptoms was approximately a few days ago, completely resolved since that time. She denies current suicidal and homicidal ideation. Family history significant for no psychiatric illness.Possible organic causes contributing are: endocrine/metabolic. Risk factors: not taking levothyroxine since 09/2010. Previous treatment includes Ativan and nothing else..  She complains of the following side effects from the treatment: none.   Past Medical History: G3P3 C sections (failure to progress then repeats) s/p BTL 09/25/2003 HTN Hypothyroidism Obesity  Past Surgical History: BTL 2005  Family History: DM - Mom, uncles x 2, cousin HTN - mom, grandma  Social History: lives with her three sons borh '03, '04, '05.  Unemplyed - watches her kids.  HS education.  no tob, etoh, drugs.     does not exercise.  wears seatbelt, doesn't wear sunscreen.  Christian.   Review of Systems  Psychiatric/Behavioral: Negative for suicidal ideas, hallucinations, behavioral problems, confusion, sleep disturbance, self-injury, dysphoric mood, decreased concentration and agitation. The patient is not nervous/anxious and is not hyperactive.        Objective:   Physical Exam  Constitutional: She is oriented to person, place, and time. She appears well-developed and well-nourished. No distress.  Cardiovascular: Normal  rate, regular rhythm, normal heart sounds and intact distal pulses.   No murmur heard. Pulmonary/Chest: Effort normal and breath sounds normal. No respiratory distress. She has no wheezes. She has no rales.  Neurological: She is alert and oriented to person, place, and time.  Psychiatric: She has a normal mood and affect. Her speech is normal and behavior is normal. Judgment and thought content normal. Cognition and memory are normal.          Assessment & Plan:

## 2011-01-04 NOTE — Assessment & Plan Note (Signed)
Pt stopped taking Levothyroxine 3 months ago.  Prior to that her TSH was 8.1 (6 months ago).  Pt stopped taking Levothyroxine because she thinks that it is the cause of her weight gain.  She states that before she started on the medication she weighed 170 lbs and the weight increased to 225 lbs, with no diet change.  Will check TSH today.  Pt is reluctant to restart Levothyroxine.  Since pt has not taken Levo in a few months if we were to restart I will not continue on previous dose of 150 mcg, but will restart at lower dose of 100 mcg.

## 2011-01-04 NOTE — Assessment & Plan Note (Signed)
Pt was seen in ER 2 days ago and sweating and "passing out".  She was dx with panic attack and given dose of Ativan.  I am not sure if that episode was a panic attack.  If it was I am unsure of cause/stressor.  She does endorse recent stress with school.  She has not been taking Levothyroxine since 09/2010 (3 months).  Her last TSH was 8.1 in 02/2010.  Pt has not been back to clinic for several months.  Hypothyroidism may be the cause of panic attack.  At this time I will check TSH before I start pt on SSRI or benzo or welbutrin.

## 2011-01-06 ENCOUNTER — Telehealth: Payer: Self-pay | Admitting: Family Medicine

## 2011-01-06 NOTE — Telephone Encounter (Signed)
Pt needs advice on taking her thyroid medication, was prescribed a new dosage but did not pick it up yet, pt was feeling bad & took the old Rx which was a higher dosage, wants to be sure that was ok.

## 2011-01-06 NOTE — Telephone Encounter (Signed)
Patient only took one dose to the 150 mcgs.  Her TSH on 5/9 was fairly elevated.  Told her she will be okay since it was only one dose but to start the 100 mcg tabs tomorrow.  I will route note to Dr. Janalyn Harder and will call patient back if she has any further recommendations.

## 2011-01-13 NOTE — H&P (Signed)
[facility]  Patient:    Amanda [name], Amanda [name]                          MRN: [id number] Adm. Date:  02/23/01 Attending:  Kathreen Cosier, M.D.                         History and Physical  HISTORY:                      The patient is a 30 year old gravida one, [facility] February 19, 2001 who was admitted in labor at 10 a.m. on June 29. The cervix was 4 cm, 100%, with the vertex at -2. The membranes were ruptured artificially and the fluid was clear. The patient had hypotonic dysfunction and received Pitocin stimulation. From the time of first admission at 10 a.m., she made no further progress up to 4 p.m., and it was decided she would deliver by cesarean section because of failure to progress in labor. Estimated fetal weight was 7 pounds 14 ounces.  PHYSICAL EXAMINATION:  HEENT:                        Negative.  LUNGS:                        Clear.  HEART:                        Regular rhythm, no murmurs, no gallops.  ABDOMEN:                      Term size uterus, estimated fetal weight 7 pounds 14 ounces.  BREASTS:                      No masses.  PELVIC:                       As described above.  EXTREMITIES:                  Negative. DD:  02/23/01 TD:  02/23/01 Job: [id number]

## 2011-01-13 NOTE — Op Note (Signed)
[facility]  Patient:    Amanda [name], Amanda [name] Visit Number: [id number] MRN: [id number]          Service Type: OBS Location: 910A 9118 01 Attending Physician:  Venita Sheffield Dictated by:   Kathreen Cosier, M.D. Proc. Date: 02/21/02 Admit Date:  02/20/2002 Discharge Date: 02/23/2002                             Operative Report  PREOPERATIVE DIAGNOSES:       Failure to progress in labor, previous cesarean section at term.  ANESTHESIA:                   Epidural.  PROCEDURE:                    The patient was placed on the operating room table in supine position.  Abdomen prepped and draped.  Bladder emptied with Foley catheter.  Transverse suprapubic incision made through the old scar, carried down to the rectus fascia.  Fascia cleaned and incised the length of the incision.  Recti muscles retracted laterally.  Peritoneum incised longitudinally.  Transverse incision made in the visceroperitoneum above the bladder and the bladder mobilized inferiorly.  Transverse lower uterine incision made.  The patient delivered from the OP position.  There was a retraction ring and a T-incision made on the uterus to affect delivery from the OP position of a female Apgar 9/9 weighing 8 pounds.  The placenta was posterior, removed manually.  Uterine incision closed in one layer with continuous suture of #1 chromic.  Hemostasis satisfactory.  Bladder flap reattached with 2-0 chromic.  Uterus well contracted.  Tubes and ovaries normal.  Abdomen closed in layers.  Peritoneum continuous suture of 0 chromic. Fascia continuous suture of 0 Dexon.  Skin closed with subcuticular stitch of 3-0 plain.  The patient tolerated the procedure well.  Taken to recovery room in good condition. Dictated by:   Kathreen Cosier, M.D. Attending Physician:  Venita Sheffield DD:  02/21/02 TD:  02/23/02 Job: [id number]

## 2011-01-13 NOTE — Discharge Summary (Signed)
[facility]  Patient:    DNASIA, GAUNA Visit Number: [id number] MRN: [id number]          Service Type: OBS Location: 910A 9118 01 Attending Physician:  Venita Sheffield Dictated by:   Roseanna Rainbow, M.D. Admit Date:  02/20/2002 Discharge Date: 02/23/2002   CC:         Kathreen Cosier, M.D.   Discharge Summary  CHIEF COMPLAINT:              The patient is a 30 year old, gravida 2, para 1, EDC February 24, 2002, who presents with contractions.  HISTORY OF PRESENT ILLNESS:   As above.  She has a history of a cesarean delivery for arrest of dilatation.  She desires a trial of labor.  Antepartum course remarkable for Group B Strep positive cultures on January 27, 2002.  PAST OBSTETRIC HISTORY:       As above.  PAST MEDICAL HISTORY:         She denies.  PAST SURGICAL HISTORY:        As above.  MEDICATIONS:                  Prenatal vitamins.  ALLERGIES:                    No known drug allergies.  SOCIAL HISTORY:               She denies any tobacco, ethanol, or drug use.  PHYSICAL EXAMINATION:         VITAL SIGNS: Stable, afebrile.  Fetal heart rate tracing reassuring.  ABDOMEN: Gravid, soft, and nontender.  PELVIC:  Cervix 3 cm dilated, 100% effaced, with vertex at -2 station.  ASSESSMENT:                   At term, latent labor, with a history of a previous cesarean delivery for trial of labor.  PLAN:                         Admission, GBS prophylaxis, expectant management.  HOSPITAL COURSE:              This patient was subsequently admitted.  Maximum dilatation was 6 cm and she was felt to have arrest of dilatation.  The fetal heart rate tracing demonstrated variable decellerations.  The patient was taken for repeat cesarean section.  Please see the operative dictation for further details.  Postoperative course was uneventful.  Her hemoglobin was 8.2 on postoperative day #1, however, her preoperative hemoglobin was 8.7.   She was discharged to home on postoperative day #2, tolerating a regular diet.  DISCHARGE DIAGNOSES:          1. Term intrauterine pregnancy.                               2. Arrest of dilatation.  PROCEDURE:                    Repeat cesarean section complicated by a Bandles ring with a T-incision.  CONDITION ON DISCHARGE:       Good.  DIET:                         Regular.  ACTIVITY:  As per the instruction booklet.  DISCHARGE MEDICATIONS:        1. Percocet one to two tablets p.o. q.i.d.                                  p.r.n.                               2. Ibuprofen 600 mg p.o. q.i.d. p.r.n.                               3. Ortho-Tricyclen load for second Sunday.                               4. Ferrous sulfate 325 mg p.o. t.i.d.                               5. Prenatal vitamins one tablet p.o. q.d.                               6. Colace p.r.n.  DISPOSITION:                  The patient was to return to Dr. Tawny Hopping office in six weeks for routine postpartum check. Dictated by:   Roseanna Rainbow, M.D. Attending Physician:  Venita Sheffield DD:  02/23/02 TD:  02/24/02 Job: [id number]

## 2011-01-13 NOTE — Op Note (Signed)
[facility]  Patient:    Amanda [name], Amanda [name]                        MRN: [id number] Proc. Date: 02/23/01 Adm. Date:  [id number] Attending:  Venita Sheffield                           Operative Report  PREOPERATIVE DIAGNOSIS:       Failure to progress in labor.  POSTOPERATIVE DIAGNOSIS:      ______  OPERATION:                    Low transverse cesarean section.  SURGEON:                      Kathreen Cosier, M.D.  ANESTHESIA:                   Epidural.  ESTIMATED BLOOD LOSS:         400 cc.  DESCRIPTION OF PROCEDURE:     The patient was placed on the operating table in a supine position. Abdomen was prepped and draped and the bladder was emptied with a Foley catheter. A transverse suprapubic incision was made and carried down to the rectus fascia and the fascia cleanly incised the length of the incision. The rectus muscles were retracted laterally. The peritoneum was incised longitudinally. A transverse incision was made on the visceral peritoneum above the bladder and the bladder ______ mobilized inferiorly. A transverse lower uterine incision was made, the fluid was clear. The patient was delivered from the OP position of a female, Apgars 8 and 9. There was a nuchal cord x 1 and there was also a true knot in the cord. The baby weighed 8 pounds. The team was in attendance. Placenta was anterior, removed manually. The uterine cavity was cleaned with dry laps. The uterine incision was closed in one layer with continuous suture of #1 chromic. Hemostasis was satisfactory. The bladder flap was reattached with 2-0 chromic. The uterus was well contracted. Tubes and ovaries were normal. The abdomen was closed in layers; the peritoneum, continuous suture of 0 chromic; the fascia, continuous suture of 0 Dexon. The skin was closed with subcuticular stitch of 3-0 plain. Blood loss was 400 cc. The patient tolerated the procedure well and taken  to recovery room in good condition. DD:  02/23/01 TD:  02/23/01 Job: [id number]

## 2011-01-13 NOTE — H&P (Signed)
[facility]  Patient:    Amanda [name], Amanda [name] Visit Number: [id number] MRN: [id number]          Service Type: OBS Location: 910A 9118 01 Attending Physician:  Venita Sheffield Dictated by:   Kathreen Cosier, M.D. Admit Date:  02/20/2002                           History and Physical  HISTORY OF PRESENT ILLNESS:  The patient is a 30 year old gravida 2 para 1 with a previous C section; EDC February 24, 2002.  She was admitted in labor and at 8 a.m. her cervix was 5 cm, 100%, vertex, -2.  Fluid was clear.  The patient was on Pitocin stimulation and by 11:30 a.m. had made no progress and the cervix had become thick.  She was having variable decelerations so it was decided she would be delivered by repeat C section.  PHYSICAL EXAMINATION:  GENERAL:                      Revealed a well-developed female in labor.  HEENT:                        Negative.  LUNGS:                        Clear.  HEART:                        Regular rhythm, no murmurs or gallops.  ABDOMEN:                      Term-sized uterus.  EXTREMITIES:                  Negative. Dictated by:   Kathreen Cosier, M.D. Attending Physician:  Venita Sheffield DD:  02/21/02 TD:  02/21/02 Job: [id number]

## 2011-01-13 NOTE — Discharge Summary (Signed)
[facility]  Patient:    Amanda [name], Amanda [name]                        MRN: [id number] Adm. Date:  [id number] Disc. Date: [id number] Attending:  Venita Sheffield                           Discharge Summary  HISTORY OF PRESENT ILLNESS:   The patient is a 30 year old primigravida, [facility] February 19, 2001, who was admitted on labor.  The cervix was 4 cm and 100% with a vertex -2 to -3 station.  Membranes were ruptured and the fluid was clear.  HOSPITAL COURSE:              The patient received Pitocin stimulation and underwent a primary low transverse cesarean section because of failure to progress in labor.  She had a nuchal cord x 1 and a true knot in the cord. The baby weighed 8 pounds.  Postoperatively, she did well.  Her hemoglobin postoperatively was 8.7.  DISPOSITION:                  She was discharged on the third postoperative day.  DISCHARGE MEDICATIONS:        1. Tylox one p.o. q.4h. p.r.n.                               2. Ferrous sulfate 325 mg q.d.  FOLLOW-UP:                    To see me in six weeks.  DISCHARGE DIAGNOSIS:          Status post primary low transverse cesarean section with delivery an infant for the occipitoposterior position.  CONDITION ON DISCHARGE:       The patient was ambulatory and on a regular diet. DD:  02/26/01 TD:  02/26/01 Job: [id number]

## 2011-01-13 NOTE — Op Note (Signed)
NAME:  Amanda [name], Amanda [name]                           ACCOUNT NO.:  [id number]   MEDICAL RECORD NO.:  [id number]                   PATIENT TYPE:  INP   LOCATION:  9127                                 FACILITY:  [facility]   PHYSICIAN:  Kathreen Cosier, M.D.           DATE OF BIRTH:  22-Dec-1980   DATE OF PROCEDURE:  09/15/2003  DATE OF DISCHARGE:                                 OPERATIVE REPORT   PREOPERATIVE DIAGNOSIS:  Previous cesarean section x2 at term, for repeat  low transverse cesarean section and tubal ligation.   SURGEON:  Kathreen Cosier, M.D.   FIRST ASSISTANT:  Charles A. Clearance Coots, M.D.   DESCRIPTION OF PROCEDURE:  The patient placed on the operating table in  supine position after the spinal administered.  Abdomen prepped and draped,  bladder emptied with a Foley catheter.  A transverse suprapubic incision  made and carried down to the rectus fascia.  Fascia cleaned and incised the  length of the incision.  The recti muscles were retracted laterally,  peritoneum incised longitudinally.  A transverse incision made in the  visceral peritoneum above the bladder and the bladder mobilized.  However,  the patient had had a previous low midline incision and the vertex was not  in the lower segment and the lower segment was not developed, so a low  vertical incision was made and the fluid was clear.  The patient delivered  of a female, Apgar 9 and 9, weighing 6 pounds 10 ounces.  The placenta was  anterior, removed manually, the uterine cavity cleaned with dry laps.  The  uterine incision closed in two layers with continuous suture of #1 chromic.  The ovaries were normal, tubes normal.  The right tube grasped in the  midportion with a Babcock clamp and 0 plain suture placed in the mesosalpinx  below the portion of the tube within the clamp.  This was tied and  approximately one inch of tube transected.  The procedure done in a similar  fashion on the other side.  The bladder flap was  reattached.  Lap and sponge  counts correct.  Abdomen closed in layers, peritoneum with a continuous  suture of 0 chromic, fascia with a continuous suture of 0 Dexon, and the  skin closed with subcuticular stitch of 3-0 Monocryl.                                               Kathreen Cosier, M.D.    BAM/MEDQ  D:  09/18/2003  T:  09/18/2003  Job:  [id number]

## 2011-01-13 NOTE — Discharge Summary (Signed)
NAME:  Amanda [name], Amanda [name]                           ACCOUNT NO.:  [id number]   MEDICAL RECORD NO.:  [id number]                   PATIENT TYPE:  INP   LOCATION:  9127                                 FACILITY:  [facility]   PHYSICIAN:  Kathreen Cosier, M.D.           DATE OF BIRTH:  1980-09-08   DATE OF ADMISSION:  09/15/2003  DATE OF DISCHARGE:  09/18/2003                                 DISCHARGE SUMMARY   The patient is a 30 year old gravida 3 para 2-0-0-2 who had two previous C-  sections who was at term and admitted for repeat C-section and tubal  ligation.  On admission her hemoglobin was 10.1, platelets 357, PT/PTT  normal, sodium 137, potassium 3.8, chloride 109, glucose 92.  She underwent  a repeat low transverse cesarean section and tubal ligation and  postoperatively she did well, her hemoglobin postop was 8.7.  On day #1 she  complained of right chest pain and shortness of breath, a chest x-ray and  spiral CT were performed and both were normal.  The patient subsequently did  well and was discharged home on the third postoperative day, ambulatory, on  a regular diet, on Tylox for pain with no complaints.   DISCHARGE DIAGNOSIS:  Status post repeat low transverse cesarean section and  tubal ligation at term.                                               Kathreen Cosier, M.D.    BAM/MEDQ  D:  09/18/2003  T:  09/19/2003  Job:  [id number]

## 2011-01-13 NOTE — H&P (Signed)
NAMEDHARMA, PARE NO.:  [id number]   MEDICAL RECORD NO.:  [id number]          PATIENT TYPE:  EMS   LOCATION:  MAJO                         FACILITY:  [facility]   PHYSICIAN:  Sherin Quarry, MD      DATE OF BIRTH:  Jun 08, 1981   DATE OF ADMISSION:  12/24/2004  DATE OF DISCHARGE:                                HISTORY & PHYSICAL   HISTORY OF PRESENT ILLNESS:  Amanda [name] is a previously healthy 30 year old  lady with a known history of asthma. She does not smoke.  Amanda [name]  indicates that yesterday, about 1 o'clock in the afternoon, she was taking a  nap when she awakened with a persistent dry cough. She then apparently did  not have too much more difficulty during the day; but, this morning she  awakened about 3:00 a.m. with increased cough, chest congestion, shortness  of breath and wheezing.  It was hard for her to get a breath. There was no  associated chest pain.  When her symptoms failed to resolve over the next  several hours, she ultimately presented to the emergency room about 2:00  p.m. At that time her heart rate was 116, O2 saturation 88%, pulse 116,  temperature  101, blood pressure 158/90. Since that time she has received  nebulizer treatments with albuterol x2 and also IV Solu-Medrol.  She is  improved at this time but continues to have wheezing and is  short of breath  with exertion.   As she has been in the emergency room now for approximately 10 hours, it was  felt prudent to go ahead and admit her rather than keep her in the emergency  room for a more prolonged period.   She has three children at home, all of whom are in good health and did not  have upper respiratory infections.  She is not currently working.  There is  no family history of asthma.   PAST MEDICAL HISTORY:   MEDICATIONS:  None.   ALLERGIES:  None.   OPERATIONS:  She has had children by C-section.   MEDICAL ILLNESSES:  None.   FAMILY HISTORY:  Her mother has diabetes.  She does not have any siblings.   SOCIAL HISTORY:  She does not smoke. She denies abuse of alcohol or drugs.   REVIEW OF SYSTEMS:  HEAD:  She denies headache or dizziness. EYES:  She  denies visual blurring or diplopia. EAR, NOSE AND THROAT:  Denies earache,  sinus pain or sore throat. CHEST:  See above. CARDIOVASCULAR:  Denies  orthopnea, PND, ankle edema or exertional chest pain. GI:  She did vomit on  one occasion this morning. There has been no abdominal pain, hematemesis,  melena, diarrhea. GU:  Denies dysuria or urinary frequency. GYN:  Her last  menstrual period was one week ago. There has been no problems with  menorrhagia or vaginal discharge. NEUROLOGIC:  There is no history of  seizure or stroke.  ENDOCRINE:  Denies excessive thirst, urinary frequency  or nocturia.   PHYSICAL EXAMINATION:  VITAL SIGNS:  At this point her  temperature is 98.9,  blood pressure 140/90, pulse is 108, respirations 20, O2 saturations 93%.  HEENT:  Exam is within normal limits. The tympanic membranes were clear.  Nares patent. Pharynx is without erythema or exudate. CHEST:  The breath  sounds are diminished diffusely. There are few scattered rhonchi. There is  moderate expiratory wheezing which is exacerbated by coughing.  CARDIOVASCULAR:  Reveals normal S1-S2, no gross murmurs or gallops.  ABDOMEN:  Benign. Normal bowel sounds without masses, tenderness,  organomegaly.  NEUROLOGIC:  Examination of the extremities is normal.   IMPRESSION:  Probable asthmatic bronchitis.   PLAN:  Will continue the current treatment plan.  The patient will receive  intravenous fluids, nebulizers with albuterol, steroids and antibiotics in  the form of Zithromax.  Anticipate that she will respond well to this  treatment program.  I explained to her that wheezing and chest congestion  may be a recurrent problem for her, particularly with viral illnesses; and  it would be a good idea for her to identify a primary care  doctor that could  follow her subsequently.      SY/MEDQ  D:  12/25/2004  T:  12/25/2004  Job:  [id number]

## 2011-03-22 ENCOUNTER — Ambulatory Visit (INDEPENDENT_AMBULATORY_CARE_PROVIDER_SITE_OTHER): Payer: Medicaid Other | Admitting: Family Medicine

## 2011-03-22 ENCOUNTER — Encounter: Payer: Self-pay | Admitting: Family Medicine

## 2011-03-22 ENCOUNTER — Other Ambulatory Visit (HOSPITAL_COMMUNITY)
Admission: RE | Admit: 2011-03-22 | Discharge: 2011-03-22 | Disposition: A | Discharge status: Home / Self Care | Payer: Medicaid Other | Source: Ambulatory Visit | Attending: Family Medicine | Admitting: Family Medicine

## 2011-03-22 VITALS — BP 110/73 | HR 101 | Ht 67.5 in | Wt 199.3 lb

## 2011-03-22 DIAGNOSIS — N938 Other specified abnormal uterine and vaginal bleeding: Secondary | ICD-10-CM

## 2011-03-22 DIAGNOSIS — E039 Hypothyroidism, unspecified: Secondary | ICD-10-CM

## 2011-03-22 DIAGNOSIS — Z01419 Encounter for gynecological examination (general) (routine) without abnormal findings: Secondary | ICD-10-CM | POA: Insufficient documentation

## 2011-03-22 DIAGNOSIS — Z202 Contact with and (suspected) exposure to infections with a predominantly sexual mode of transmission: Secondary | ICD-10-CM

## 2011-03-22 DIAGNOSIS — N760 Acute vaginitis: Secondary | ICD-10-CM

## 2011-03-22 DIAGNOSIS — Z7189 Other specified counseling: Secondary | ICD-10-CM | POA: Insufficient documentation

## 2011-03-22 DIAGNOSIS — F410 Panic disorder [episodic paroxysmal anxiety] without agoraphobia: Secondary | ICD-10-CM

## 2011-03-22 DIAGNOSIS — N949 Unspecified condition associated with female genital organs and menstrual cycle: Secondary | ICD-10-CM

## 2011-03-22 DIAGNOSIS — Z124 Encounter for screening for malignant neoplasm of cervix: Secondary | ICD-10-CM

## 2011-03-22 DIAGNOSIS — I10 Essential (primary) hypertension: Secondary | ICD-10-CM

## 2011-03-22 DIAGNOSIS — K5900 Constipation, unspecified: Secondary | ICD-10-CM

## 2011-03-22 LAB — RPR

## 2011-03-22 LAB — HIV ANTIBODY (ROUTINE TESTING W REFLEX): HIV: NONREACTIVE

## 2011-03-22 LAB — TSH: TSH: 1.517 u[IU]/mL (ref 0.350–4.500)

## 2011-03-22 LAB — POCT WET PREP (WET MOUNT): Trichomonas Wet Prep HPF POC: NEGATIVE

## 2011-03-22 NOTE — Patient Instructions (Signed)
I will send you a letter with your results if they are normal. If we need to change anything, like your medicine dose, I will call you. Use condoms when having sex.

## 2011-03-22 NOTE — Assessment & Plan Note (Signed)
Pap smear done today 03/22/2011.  Up to date on vaccinations.  Screen for std's performed.

## 2011-03-22 NOTE — Assessment & Plan Note (Signed)
Under good control today.  Will continue current med regimen.  Will follow bp at next visit.

## 2011-03-22 NOTE — Progress Notes (Signed)
  Subjective:    Patient ID: Amanda [name], female    DOB: [birth year], 30 y.o.   MRN: [id number]  HPI Pap- No abnormal pap smear in past.  States she is due for pap smear today and would like this test performed.   STD screening- 1 sexual partner, last sexual contact contact this past weekend, does not wear condom.  Would like to be screened for STD's.  No vaginal discharge. No vaginal bleeding.  LMP 2 weeks ago.  Has tubal.   Thyroid- + fatigue, decrease energy, no hair loss, no cold or heat intolerance.  Likes to sleep in a cold room. No constipation.  Some anxiety attacks in past.  Taking synthroid daily.  Has lost weight 20 lbs in past month ( Trying to lose weight. )  States she has been compliant with medication regimen.  BP- Takes  bp medication daily.  Currently bp 110/73.    Review of Systems As per above    Objective:   Physical Exam  Constitutional: She is oriented to person, place, and time. She appears well-developed and well-nourished.  Neck:       Mild thyromegaly  Cardiovascular: Normal rate and normal heart sounds.   No murmur heard. Pulmonary/Chest: Effort normal and breath sounds normal. No respiratory distress. She has no wheezes.  Abdominal: Soft. She exhibits no distension. There is no tenderness. There is no rebound.  Genitourinary: Vagina normal and uterus normal. There is no rash or lesion on the right labia. There is no rash or lesion on the left labia. Cervix exhibits no motion tenderness and no friability. Right adnexum displays no mass, no tenderness and no fullness. Left adnexum displays no mass, no tenderness and no fullness.       Scant vaginal discharge  Musculoskeletal: Normal range of motion. She exhibits no edema.  Neurological: She is alert and oriented to person, place, and time.  Skin: No rash noted.  Psychiatric: She has a normal mood and affect.          Assessment & Plan:

## 2011-03-23 ENCOUNTER — Encounter: Payer: Self-pay | Admitting: Family Medicine

## 2011-03-23 LAB — GC/CHLAMYDIA PROBE AMP, GENITAL
Chlamydia, DNA Probe: NEGATIVE
GC Probe Amp, Genital: NEGATIVE

## 2011-03-24 ENCOUNTER — Encounter: Payer: Self-pay | Admitting: Family Medicine

## 2011-05-11 ENCOUNTER — Other Ambulatory Visit: Payer: Self-pay | Admitting: Family Medicine

## 2011-05-11 ENCOUNTER — Telehealth: Payer: Self-pay | Admitting: Family Medicine

## 2011-05-11 DIAGNOSIS — E039 Hypothyroidism, unspecified: Secondary | ICD-10-CM

## 2011-05-11 DIAGNOSIS — I10 Essential (primary) hypertension: Secondary | ICD-10-CM

## 2011-05-11 MED ORDER — LEVOTHYROXINE SODIUM 100 MCG PO TABS: 100.0000 ug | ORAL_TABLET | Freq: Every day | ORAL | Status: DC

## 2011-05-11 MED ORDER — LISINOPRIL-HYDROCHLOROTHIAZIDE 20-25 MG PO TABS: 1.0000 | ORAL_TABLET | Freq: Every day | ORAL | Status: DC

## 2011-05-11 NOTE — Telephone Encounter (Signed)
Will ask office staff to call pt to make follow up appointment.  Pt has sent in a request for lisinopril. Rx refilled for 1 month only.  Pt is due for lab work and also need to discuss birth control options with pt since she is on lisinopril.

## 2011-05-11 NOTE — Telephone Encounter (Signed)
States that she sent refill request to pharm [facility]) on Sunday and they are telling her that they haven't heard from her PCP yet.  There is no request in [facility] box and she is now out.  Lisinopril & Synthroid.

## 2011-05-24 LAB — CBC
Hemoglobin: 11.4 — ABNORMAL LOW
MCHC: 34.1
MCV: 83.8
RBC: 3.97

## 2011-05-24 LAB — GC/CHLAMYDIA PROBE AMP, GENITAL
Chlamydia, DNA Probe: NEGATIVE
GC Probe Amp, Genital: NEGATIVE

## 2011-05-24 LAB — WET PREP, GENITAL
Clue Cells Wet Prep HPF POC: NONE SEEN
Yeast Wet Prep HPF POC: NONE SEEN

## 2011-05-26 LAB — CBC
Hemoglobin: 9.6 — ABNORMAL LOW
MCHC: 32.4
MCV: 81.3
RBC: 3.66 — ABNORMAL LOW

## 2011-05-26 LAB — CROSSMATCH

## 2011-05-29 ENCOUNTER — Encounter: Payer: Self-pay | Admitting: Family Medicine

## 2011-05-29 ENCOUNTER — Ambulatory Visit (INDEPENDENT_AMBULATORY_CARE_PROVIDER_SITE_OTHER): Payer: Medicaid Other | Admitting: Family Medicine

## 2011-05-29 DIAGNOSIS — I10 Essential (primary) hypertension: Secondary | ICD-10-CM

## 2011-05-29 DIAGNOSIS — E039 Hypothyroidism, unspecified: Secondary | ICD-10-CM

## 2011-05-29 DIAGNOSIS — E669 Obesity, unspecified: Secondary | ICD-10-CM

## 2011-05-29 LAB — BASIC METABOLIC PANEL
CO2: 29 mEq/L (ref 19–32)
Calcium: 9.9 mg/dL (ref 8.4–10.5)
Creat: 1.06 mg/dL (ref 0.50–1.10)
Glucose, Bld: 87 mg/dL (ref 70–99)

## 2011-05-29 NOTE — Patient Instructions (Signed)
Return in 6 months for lab drawn for TSH Also you can check weight at that time. (the nurse can do this for you) F/up up with me in 1 year.

## 2011-05-30 ENCOUNTER — Encounter: Payer: Self-pay | Admitting: Family Medicine

## 2011-06-01 NOTE — Assessment & Plan Note (Signed)
Continue lisinopril. Confirmed the patient has had tubal for birth control. Will recheck creatinine today.

## 2011-06-01 NOTE — Assessment & Plan Note (Signed)
Will recheck TSH in 6 months 

## 2011-06-01 NOTE — Progress Notes (Signed)
  Subjective:    Patient ID: Amanda [name], female    DOB: 04-06-1981, 30 y.o.   MRN: [id number]  HPI Hypertension: On lisinopril/HCTZ. Blood pressure 110/70. Well controlled. Due for recheck of creatinine. Patient has had tubal to prevent pregnancy.  Obesity: Weight improved from 218 pounds one year ago to 186 pounds today. Patient states that she has decreased portion sizes. Also has increased exercise. Also taking thyroid supplementation as directed.  Birth control: Patient has had tubal.     Review of Systems As per above    Objective:   Physical Exam  Constitutional: She is oriented to person, place, and time. She appears well-developed and well-nourished.  Neck: No thyromegaly present.  Cardiovascular: Normal rate and regular rhythm.   No murmur heard. Pulmonary/Chest: Effort normal and breath sounds normal. No respiratory distress. She has no wheezes.  Abdominal: Soft. She exhibits no distension.  Musculoskeletal: Normal range of motion. She exhibits no edema.  Neurological: She is alert and oriented to person, place, and time.  Skin: No rash noted.  Psychiatric: She has a normal mood and affect. Her behavior is normal.          Assessment & Plan:

## 2011-06-01 NOTE — Assessment & Plan Note (Signed)
Patient goal weight is 150 pounds-per her report. Patient has had significant weight loss during the past year. Congratulated her on this. Patient to return in 6 months for weight check with nurse. Or sooner if I can help in any way with her weight loss.

## 2011-08-23 ENCOUNTER — Telehealth: Payer: Self-pay | Admitting: *Deleted

## 2011-08-23 NOTE — Telephone Encounter (Signed)
Pt stated that she has been on her period since beginning of the month some days its heavy but no clotting. hasnt had a period in a month or two pt has tubal ligation. She is sexually active and does not always use protection. Wonders if it has something to do with her thyroid is asking if she can get something called into the pharmacy ([facility]).

## 2011-08-23 NOTE — Telephone Encounter (Signed)
Called pt and advised to schedule OV. Pt agreed. .Terrilyn Tyner  

## 2011-08-30 ENCOUNTER — Encounter: Payer: Self-pay | Admitting: Family Medicine

## 2011-08-30 ENCOUNTER — Ambulatory Visit (INDEPENDENT_AMBULATORY_CARE_PROVIDER_SITE_OTHER): Payer: Self-pay | Admitting: Family Medicine

## 2011-08-30 VITALS — BP 130/80 | HR 100 | Ht 67.0 in | Wt 188.3 lb

## 2011-08-30 DIAGNOSIS — N920 Excessive and frequent menstruation with regular cycle: Secondary | ICD-10-CM

## 2011-08-30 MED ORDER — NORGESTIMATE-ETH ESTRADIOL 0.25-35 MG-MCG PO TABS: 1.0000 | ORAL_TABLET | Freq: Every day | ORAL | Status: DC

## 2011-08-30 NOTE — Progress Notes (Signed)
  Subjective:    Patient ID: Amanda [name], female    DOB: 1980/10/13, 31 y.o.   MRN: [id number]  HPI Patient is here for heavy menses: Patient states that she has had heavy periods off and on x8 years. --since the birth of her last child. She will skip 2-3 months and have a heavy period that lasts almost a month.  Patient states that she was on birth control for this in the past and that this helped. This was stopped By another provider and she is not sure why. Patient has had a tubal ligation to prevent pregnancy. Patient does not use condoms for protection-she states she is in a monogamous relationship.  Patient states she would prefer to have no period or at least have a regular period each month. She states that she has felt dizzy when standing.    Last thyroid check in October was within normal limits. Hemoglobin checked today was 11.2. No syncopal episodes. Positive fatigue/decreased energy.  Has a history of having such severe menorrhagia that she had had a transfusion in the past--this was 2 years ago.   Review of Systems As per above.    Objective:   Physical Exam  Constitutional: She is oriented to person, place, and time. She appears well-developed and well-nourished.  HENT:  Head: Normocephalic and atraumatic.  Eyes: Conjunctivae are normal. Right eye exhibits no discharge. Left eye exhibits no discharge. No scleral icterus.  Neck: Normal range of motion.  Cardiovascular: Normal rate, regular rhythm and normal heart sounds.   No murmur heard. Pulmonary/Chest: Effort normal. No respiratory distress. She has no wheezes.  Abdominal: Soft. She exhibits no distension. There is no tenderness. There is no rebound and no guarding.  Musculoskeletal: She exhibits no edema.  Neurological: She is alert and oriented to person, place, and time.  Skin: No rash noted.  Psychiatric: She has a normal mood and affect. Her behavior is normal.          Assessment & Plan:

## 2011-08-30 NOTE — Patient Instructions (Signed)
Take sprintec daily as directed Return in 3months or sooner if needed.  If no improvement in bleeding within the next 2 weeks please return to see me.  If any new or worsening symptoms return to see me sooner.

## 2011-09-05 DIAGNOSIS — N920 Excessive and frequent menstruation with regular cycle: Secondary | ICD-10-CM | POA: Insufficient documentation

## 2011-09-05 NOTE — Assessment & Plan Note (Signed)
Long h/o abnormal and heavy cycles.  Since bcp seemed to control bleeding and irregularity in past-- Will restart pt on oral contraceptions. Discussed risk and benefits of hormone therapy.  Pt to return in 2-3 months for f/u or sooner if needed.  If new or worsening of symptoms pt is to return to care. hbg 11.2 today.  Also encouraged pt to take iron supplement and to drink plenty of fluids.

## 2011-10-03 ENCOUNTER — Other Ambulatory Visit: Payer: Self-pay | Admitting: Family Medicine

## 2011-10-03 DIAGNOSIS — I10 Essential (primary) hypertension: Secondary | ICD-10-CM

## 2011-10-03 MED ORDER — LISINOPRIL-HYDROCHLOROTHIAZIDE 20-25 MG PO TABS: 1.0000 | ORAL_TABLET | Freq: Every day | ORAL | Status: DC

## 2012-02-08 ENCOUNTER — Other Ambulatory Visit: Payer: Self-pay | Admitting: *Deleted

## 2012-02-08 ENCOUNTER — Encounter: Payer: Self-pay | Admitting: Family Medicine

## 2012-02-08 ENCOUNTER — Ambulatory Visit (INDEPENDENT_AMBULATORY_CARE_PROVIDER_SITE_OTHER): Payer: Self-pay | Admitting: Family Medicine

## 2012-02-08 VITALS — BP 114/74 | HR 85 | Ht 67.0 in | Wt 211.0 lb

## 2012-02-08 DIAGNOSIS — R0609 Other forms of dyspnea: Secondary | ICD-10-CM

## 2012-02-08 DIAGNOSIS — G4730 Sleep apnea, unspecified: Secondary | ICD-10-CM

## 2012-02-08 DIAGNOSIS — R0989 Other specified symptoms and signs involving the circulatory and respiratory systems: Secondary | ICD-10-CM

## 2012-02-08 DIAGNOSIS — R0683 Snoring: Secondary | ICD-10-CM

## 2012-02-08 DIAGNOSIS — Z833 Family history of diabetes mellitus: Secondary | ICD-10-CM

## 2012-02-08 NOTE — Patient Instructions (Addendum)
My nurse will call you with your sleep study appointment.   We will screen you for diabetes check.  Look up acanthosis nigricans-the dark line on your neck.

## 2012-02-08 NOTE — Progress Notes (Signed)
  Subjective:    Patient ID: Amanda [name], female    DOB: 1980/12/07, 31 y.o.   MRN: [id number]  HPI Snoring: Patient reports long history of snoring. During the past 6-8 months boyfriend states she has had pauses in breathing during the night. She has gained 30 pounds during the past 6 months.  Has noticed increased frequency of headaches. Headaches do respond to Aleve. No family history of sleep apnea. Does have frequent nighttime awakenings. Also reports daytime sleepiness. And fatigue. Some body aches. No fever. No edema. No abdominal pain. No wheezing.   Smoking status reviewed.   Review of Systems As per above.    Objective:   Physical Exam  Constitutional: She appears well-developed and well-nourished. No distress.  HENT:  Head: Normocephalic and atraumatic.  Nose: Nose normal.  Mouth/Throat: Oropharynx is clear and moist. No oropharyngeal exudate.  Neck: Normal range of motion.  Cardiovascular: Normal rate, regular rhythm and normal heart sounds.   No murmur heard. Pulmonary/Chest: Effort normal. No respiratory distress. She has no wheezes. She has no rales.  Musculoskeletal: She exhibits no edema.  Neurological: She is alert.  Psychiatric: She has a normal mood and affect.          Assessment & Plan:

## 2012-02-10 DIAGNOSIS — R0683 Snoring: Secondary | ICD-10-CM | POA: Insufficient documentation

## 2012-02-10 NOTE — Assessment & Plan Note (Signed)
With boyfriend reporting pauses in breathing in the setting of recent weight gain and symptoms of daytime sleepiness and frequent headaches.  Need to r/o diagnosis of sleep apnea.  Pt agrees to sleep study.  Will have nursing staff arrange this.  Pt to follow up with me after the test.  Pt agrees with this plan.

## 2012-03-05 ENCOUNTER — Ambulatory Visit (HOSPITAL_BASED_OUTPATIENT_CLINIC_OR_DEPARTMENT_OTHER): Payer: Medicaid Other | Attending: Family Medicine | Admitting: Radiology

## 2012-03-05 VITALS — Ht 67.0 in | Wt 211.0 lb

## 2012-03-05 DIAGNOSIS — G4710 Hypersomnia, unspecified: Secondary | ICD-10-CM | POA: Insufficient documentation

## 2012-03-05 DIAGNOSIS — R0683 Snoring: Secondary | ICD-10-CM

## 2012-03-05 DIAGNOSIS — G4730 Sleep apnea, unspecified: Secondary | ICD-10-CM | POA: Insufficient documentation

## 2012-03-07 DIAGNOSIS — G4710 Hypersomnia, unspecified: Secondary | ICD-10-CM

## 2012-03-07 DIAGNOSIS — G4730 Sleep apnea, unspecified: Secondary | ICD-10-CM

## 2012-03-08 NOTE — Procedures (Signed)
Amanda [name], Amanda [name]                 ACCOUNT NO.:  [id number]  MEDICAL RECORD NO.:  [id number]          PATIENT TYPE:  OUT  LOCATION:  SLEEP CENTER                 FACILITY:  [facility]  PHYSICIAN:  Lexie Morini D. Maple Hudson, MD, FCCP, FACPDATE OF BIRTH:  05-05-1981  DATE OF STUDY:  03/05/2012                           NOCTURNAL POLYSOMNOGRAM  REFERRING PHYSICIAN:  Sarah Swaziland, MD  INDICATION FOR STUDY:  Hypersomnia with sleep apnea.  EPWORTH SLEEPINESS SCORE:  3/24.  BMI 33, weight 211 pounds.  Height 67 inches, neck 15.5 inches.  MEDICATIONS:  Home medications are charted and reviewed.  SLEEP ARCHITECTURE:  Total sleep time 325.5 minutes with sleep efficiency 90.4%.  Stage I was 4.9%, stage II 80.8%.  Stage III absent, REM 14.3% of total sleep time.  Sleep latency 10 minutes, REM latency 166 minutes.  Awake after sleep onset 23.5 minutes.  Arousal index 9. Bedtime medication:  None.  RESPIRATORY DATA:  Apnea-hypopnea index (AHI) 0.9 per hour.  A total of 5 events was scored, all as hypopneas associated with nonsupine sleep position and REM.  REM AHI 5.2 per hour.  There were insufficient numbers of events to meet protocol requirements for initiation of split protocol, CPAP titration on the study night.  OXYGEN DATA:  Snoring moderately loud with oxygen desaturation to a nadir of 85% and mean oxygen saturation through the study of 97% on room air.  CARDIAC DATA:  Normal sinus rhythm.  MOVEMENT-PARASOMNIA:  No significant movement disturbance.  Bathroom x2.  IMPRESSIONS-RECOMMENDATIONS:  Occasional respiratory events with sleep disturbance, within normal limits.  AHI is 0.9 per hour (the normal range for adults is from 0-5 events per hour).  Moderately loud snoring with oxygen desaturation to a nadir of 85% and mean oxygen saturation through the study on room air 97%.  There were insufficient numbers of events to the protocol requirements for application of split protocol, CPAP titration  on the study night.     Summerlynn Glauser D. Maple Hudson, MD, [facility], FACP Diplomate, American Board of Sleep Medicine   CDY/MEDQ  D:  03/07/2012 19:41:45  T:  03/08/2012 06:22:49  Job:  [id number]

## 2012-03-15 ENCOUNTER — Encounter: Payer: Self-pay | Admitting: Family Medicine

## 2012-03-15 ENCOUNTER — Ambulatory Visit (INDEPENDENT_AMBULATORY_CARE_PROVIDER_SITE_OTHER): Payer: Self-pay | Admitting: Family Medicine

## 2012-03-15 VITALS — BP 126/72 | HR 80 | Ht 67.0 in | Wt 217.0 lb

## 2012-03-15 DIAGNOSIS — R0989 Other specified symptoms and signs involving the circulatory and respiratory systems: Secondary | ICD-10-CM

## 2012-03-15 DIAGNOSIS — N920 Excessive and frequent menstruation with regular cycle: Secondary | ICD-10-CM

## 2012-03-15 DIAGNOSIS — R0683 Snoring: Secondary | ICD-10-CM

## 2012-03-15 DIAGNOSIS — R0609 Other forms of dyspnea: Secondary | ICD-10-CM

## 2012-03-15 DIAGNOSIS — I10 Essential (primary) hypertension: Secondary | ICD-10-CM

## 2012-03-15 DIAGNOSIS — L989 Disorder of the skin and subcutaneous tissue, unspecified: Secondary | ICD-10-CM

## 2012-03-15 MED ORDER — LISINOPRIL-HYDROCHLOROTHIAZIDE 20-25 MG PO TABS: 0.5000 | ORAL_TABLET | Freq: Every day | ORAL | Status: DC

## 2012-03-15 MED ORDER — HYDROCORTISONE 1 % EX OINT: TOPICAL_OINTMENT | Freq: Two times a day (BID) | CUTANEOUS | Status: DC

## 2012-03-15 MED ORDER — NORGESTIMATE-ETH ESTRADIOL 0.25-35 MG-MCG PO TABS: 1.0000 | ORAL_TABLET | Freq: Every day | ORAL | Status: DC

## 2012-03-15 NOTE — Patient Instructions (Addendum)
Blood pressure: Cut bp pill in 1/2 and take daily.  Check blood pressure at home.  Remember your goal blood pressure is less than 140/90.    I resent the prescription for the bcp to the pharmacy.   Bumps on neck: Hydrocortisone cream to help with itching.    Return as needed.

## 2012-03-16 NOTE — Progress Notes (Signed)
  Subjective:    Patient ID: Amanda [name], female    DOB: [birth year], 31 y.o.   MRN: [id number]  HPI F/up sleep study results: Pt had sleep study performed.  + for snoring but negative for sleep apnea.  Reviewed results with patient in detail.   Blood pressure f/up: Pt states that she is having to run to the bathroom constantly 2/2 the diuretic effect of her BP medication.  She would like to cut the dose in 1/2.  Pt not checking bp at home.  But per our clinic log has been consistently wnl.  Today bp 126/72.  Pt is not exercising.  And is overweight.  Not smoking.  No blurry vision.  No sob.  No chest pain.  + occasional mild headache.   Bumps on back of neck:  present x 2 days.  Concerned she may have been bitten by bugs.  Has been outside some.  No new detergents or soaps.  No exposure to poison ivy.  No history of this type of bumps in past.   Birth control pill:  never picked up birth control pill as we discussed at last visit for period regulation.  Has tubal for birth control.  Would like to start bcp at this time and would like a new rx sent.  No dizziness. No syncope.   Smoking status reviewed.   Review of Systems As per above    Objective:   Physical Exam  Constitutional: She appears well-developed and well-nourished.  HENT:  Head: Normocephalic and atraumatic.  Mouth/Throat: Oropharynx is clear and moist.  Cardiovascular: Normal rate, regular rhythm and normal heart sounds.   No murmur heard. Pulmonary/Chest: Effort normal and breath sounds normal. No respiratory distress. She has no wheezes. She has no rales.  Abdominal: Soft. She exhibits no distension.  Musculoskeletal: She exhibits no edema.  Skin: Rash noted.       + erythematous papules on right posterior neck area- approx 5-6 similar lesions scattered in this area.  No drainage.  No pus.  No s/s of infection.           Assessment & Plan:

## 2012-03-18 DIAGNOSIS — L989 Disorder of the skin and subcutaneous tissue, unspecified: Secondary | ICD-10-CM | POA: Insufficient documentation

## 2012-03-18 NOTE — Assessment & Plan Note (Signed)
rx refill given for bcp- see last ov note for details regarding this problem.

## 2012-03-18 NOTE — Assessment & Plan Note (Signed)
Most likely bug bite- pt to apply hydrocortisone for comfort- to decrease redness and itching.  Return if new or worsening of symptoms.

## 2012-03-18 NOTE — Assessment & Plan Note (Signed)
Sleep study negative for sleep apnea.  Pt encouraged to lose weight since this may help with snoring.  Answered all of pt's questions regarding sleep study.

## 2012-03-18 NOTE — Assessment & Plan Note (Signed)
Well controlled- pt can do a trial of 1/2 tablet of lisinopril/hctz to see if pressure is maintained wnl with smaller dose and to see if pt has less urinary frequency.  Pt to return in 3-4 months for f/up with new pcp.  Pt to monitor bp at home.   Reviewed bp goals.

## 2012-03-26 ENCOUNTER — Ambulatory Visit: Payer: Self-pay | Admitting: Family Medicine

## 2012-04-01 ENCOUNTER — Encounter: Payer: Self-pay | Admitting: Family Medicine

## 2012-04-01 ENCOUNTER — Ambulatory Visit (INDEPENDENT_AMBULATORY_CARE_PROVIDER_SITE_OTHER): Payer: Medicaid Other | Admitting: Family Medicine

## 2012-04-01 VITALS — BP 118/77 | HR 88 | Temp 98.6°F | Ht 67.0 in | Wt 214.0 lb

## 2012-04-01 DIAGNOSIS — N920 Excessive and frequent menstruation with regular cycle: Secondary | ICD-10-CM

## 2012-04-01 LAB — POCT HEMOGLOBIN: Hemoglobin: 9.3 g/dL — AB (ref 12.2–16.2)

## 2012-04-01 MED ORDER — MELOXICAM 15 MG PO TABS: 15.0000 mg | ORAL_TABLET | Freq: Every day | ORAL | Status: DC

## 2012-04-01 NOTE — Patient Instructions (Signed)
Take 2 or 3 tablets of the birth control pills until you finish the monthly pack.  When you start the next pack, take 1 tablet a day as normal.   Follow-up in 1 week if your periods are not letting up or if you feel lightheaded.

## 2012-04-01 NOTE — Progress Notes (Signed)
  Subjective:    Patient ID: Amanda [name], female    DOB: 05-07-1981, 31 y.o.   MRN: [id number]  HPI # Work-in: heavy menses on OCP She has a history of metomenorrhagia She started OCPs 07/19 and started having current menstrual period 07/31 She reports changing her pads every 30 minutes to few hours. She wears one overnight pad a day She is also having severe lower abdominal cramping. Taking Alleve 2 tablets a day is not helping  She is frustrated with her heavy periods and would like a hysterectomy She is s/p BTL  Review of Systems Occasional lightheadedness; denies vaginal discharge; intercourse with one partner only  Allergies, medication, past medical history reviewed.  Pelvic U/S 12/2007: endometrial stripe 4 mm; uterus and ovaries WNL    Objective:   Physical Exam GEN: NAD; well-nourished, -appearing; obese PSYCH: appears frustrated ABD: no tenderness, soft, obese    Assessment & Plan:

## 2012-04-01 NOTE — Assessment & Plan Note (Signed)
She has a history of metomenorrhagia and was started on OCP on 07/19 to help with this issue. However, she started her current period on 07/31, and she presents today because it is very heavy.  -Recommend increasing OCP to 2-3 tablets (if she can tolerate; discussed potential nausea) daily until she finished current monthly pack -Will refer to Gynecology since she is interested in hysterectomy. She is s/p BTL. Pelvic U/S 12/2007 did not show abnormalities -Will check POC Hgb today>>>9.3. Recommending iron bid. If period persists or if she is lightheaded, recommend she come back to clinic. She has required blood transfusions in the past for heavy periods.  -Naproxen prn menstrual cramping

## 2012-04-08 ENCOUNTER — Telehealth: Payer: Self-pay | Admitting: Family Medicine

## 2012-04-08 NOTE — Telephone Encounter (Signed)
Patient wants a referral to go to Dr. Trudie Buckler.  She has an appt on 8/19 and needs this before then.  She doesn't want to wait for the [facility] so she is going to cancel that appt.

## 2012-04-09 ENCOUNTER — Other Ambulatory Visit: Payer: Self-pay | Admitting: *Deleted

## 2012-04-09 ENCOUNTER — Encounter: Payer: Self-pay | Admitting: Family Medicine

## 2012-04-09 ENCOUNTER — Ambulatory Visit (INDEPENDENT_AMBULATORY_CARE_PROVIDER_SITE_OTHER): Payer: Medicaid Other | Admitting: Family Medicine

## 2012-04-09 VITALS — BP 137/83 | HR 92 | Temp 99.2°F | Ht 68.0 in | Wt 218.8 lb

## 2012-04-09 DIAGNOSIS — N920 Excessive and frequent menstruation with regular cycle: Secondary | ICD-10-CM

## 2012-04-09 DIAGNOSIS — D509 Iron deficiency anemia, unspecified: Secondary | ICD-10-CM

## 2012-04-09 DIAGNOSIS — E039 Hypothyroidism, unspecified: Secondary | ICD-10-CM

## 2012-04-09 MED ORDER — NORGESTIMATE-ETH ESTRADIOL 0.25-35 MG-MCG PO TABS: 1.0000 | ORAL_TABLET | Freq: Every day | ORAL | Status: DC

## 2012-04-09 MED ORDER — ONDANSETRON HCL 8 MG PO TABS: 8.0000 mg | ORAL_TABLET | Freq: Three times a day (TID) | ORAL | Status: AC | PRN

## 2012-04-09 MED ORDER — IBUPROFEN 600 MG PO TABS: 600.0000 mg | ORAL_TABLET | Freq: Four times a day (QID) | ORAL | Status: AC | PRN

## 2012-04-09 NOTE — Telephone Encounter (Signed)
Called Dr.Marshall's office and gave referral. Faxed notes and labs to Attn: Ms Freida Busman. Lorenda Hatchet, Renato Battles

## 2012-04-09 NOTE — Telephone Encounter (Signed)
Spoke with patient and she states she is continuing to bleed heavy and goes thru a pad in 30 minutes. This has been going on for a month she states. Has completes Birth control pills as she was instructed at last visit.  Offered appointment now but she requests this afternoon. Appointment scheduled for work in this afternoon

## 2012-04-09 NOTE — Progress Notes (Signed)
Subjective:     Patient ID: Amanda [name], female   DOB: February 06, 1981, 31 y.o.   MRN: [id number]  HPI Pt is a 31 yo G?P3 woman with a  Hx of hypothyroidism and irregular heavy menstrual bleedingc/o 1 month history of vaginal bleeding and severe cramping.  Pt reports seeing "blood clots" when going to the bathroom.  She uses 1 pad every 30 minutes.  She was placed on birth control on July 19th 2013 to help with the intensity of her menses but the patient reports that this has not helped.  She was seen in clinic 1 week ago for the same complaint and was given meloxicam 15 mg once daily for cramping and told to double her birth control but there has been no change on the intensity of her menstrual bleeding or cramping.  Her hemoglobin was found to be 9.? Per patient 1 week ago.  She reports a history of heavy menstral bleeding that usually last ~1 month since having her tubes tide after her 3rd pregnancy in 2005.  Since then she has needed 1 blood transfusion (2009).  She is in a monogamous relationship and is sexually active.  Family history is significant for her grandmother and aunt needing hysterectomies for irregular menses, but no known hx of bleeding d/o or coagulopathies.    Review of Systems See HPI.     Objective:   Physical Exam Deferred  Labs: Hgb 9.9     Assessment:     31 yo female with history of hypothyroidism & irregular heavy menstrual bleeding with 1 month hx of heavy bleeding.    Plan:    Bleeding:  -Start patient on high dose birth control course.   -3 day course of 4 pills/day  -2 day course of 3 pills/day  -1 day course of 2 pills/day  -Skip white pills & starting taking 1 pill per day of new birth control pack. Continue for 2 months  -Pt has appointment with Dr. Trudie Buckler for consult on need for hysterectomy  -Hbg rechecked in office = 9.9  -Continue iron supplements  Hypothyroidism  -recheck TSH and free T4  -adjust thyroid medication based on results of  labs

## 2012-04-09 NOTE — Progress Notes (Signed)
  Subjective:    Patient ID: Amanda [name], female    DOB: [birth year], 31 y.o.   MRN: [id number]  HPI Here for re-evaluation of heavy menstrual bleeding.  Has been recurrent since 2005 at the birth of her child.  History of BTL.  Has been seen by several doctors recently.  Has gynecology referral later this month pending.  Patient noted longtsanding history of menses every 2-3 months with heavy bleeds.  Has been bleeding 30 days of menstrual bleeding, clots, Usuing 1 pad every 30 minutes.   Was placed on 2 tabs sprintec x 10 days, now complete.  States no change in menstruation.  Has never been on OCP for more than a few weeks.  Has not had IUD.  I have reviewed patient's  PMH, FH, and Social history and Medications as related to this visit.  Review of Systems No vaginal discharge.  Some abdominal cramping.      Objective:   Physical Exam  GEN: Alert & Oriented, No acute distress Gyn: declines at this time  Reviewed past studies:  Normal pelvic US in 2009 Normal pap 02/2011 Normal GC/ Chlamydia 02/2008 TSH has fluctuated  a1c normal in 05/2011    Assessment & Plan:

## 2012-04-09 NOTE — Telephone Encounter (Signed)
Called Dr. Elsie Stain office and left message for call back. Please confirm pt's appt 04/15/12. We can fax labs and records. Waiting for call back. Lorenda Hatchet, Renato Battles

## 2012-04-09 NOTE — Telephone Encounter (Signed)
Patient is calling because she has had very heavy bleeding, of which she has an appt with Dr. Gaynell Face coming up, but she wants to talk to the nurse about her symptoms because she wants to be seen but Dr. Pollie Meyer doesn't have anything until September and she doesn't want to see the MD she saw last time.  She wants advice on whether she needs to be seen or not.

## 2012-04-09 NOTE — Patient Instructions (Addendum)
Take one pill four times a day for 3 days Three times a day for 2 days 2 times a day for 1 days Skip white pills  The next day Start a new pack Then start a new pack, once daily

## 2012-04-10 DIAGNOSIS — D500 Iron deficiency anemia secondary to blood loss (chronic): Secondary | ICD-10-CM | POA: Insufficient documentation

## 2012-04-10 LAB — PROLACTIN: Prolactin: 14.8 ng/mL

## 2012-04-10 MED ORDER — LEVOTHYROXINE SODIUM 112 MCG PO TABS: 112.0000 ug | ORAL_TABLET | Freq: Every day | ORAL | Status: DC

## 2012-04-10 NOTE — Assessment & Plan Note (Signed)
From menorrhagia.  Stable today.  Advised to continue iron supplements.

## 2012-04-10 NOTE — Assessment & Plan Note (Signed)
TSH elevated.  Will increase synthroid, patient to return for lab check in 6 weeks

## 2012-04-10 NOTE — Assessment & Plan Note (Signed)
Anovulatory menorrhagia - TSH elevated on labs may be contributing will increase synthroid - Will complete workup with prolactin -  Will start higher dose OCP taper- see patient instructions - at the end of taper, instructed patient to continue OCP, advised needs to take consistently for 3 months.  Has never been on consistent OCP-  Thus has not failed medical therapy. - Has gyn appt later this month per previous referral

## 2012-04-15 ENCOUNTER — Other Ambulatory Visit: Payer: Self-pay | Admitting: Obstetrics

## 2012-05-01 ENCOUNTER — Encounter (HOSPITAL_COMMUNITY): Payer: Self-pay | Admitting: Pharmacist

## 2012-05-01 ENCOUNTER — Encounter: Payer: Medicaid Other | Admitting: Obstetrics & Gynecology

## 2012-05-08 NOTE — H&P (Unsigned)
[name], [name] NO.:  [id number]  MEDICAL RECORD NO.:  [id number]  LOCATION:                                 FACILITY:  PHYSICIAN:  Kathreen Cosier, M.D.DATE OF BIRTH:  09-10-1980  DATE OF ADMISSION:  05/15/2012 DATE OF DISCHARGE:                             HISTORY & PHYSICAL   DATE OF OPERATION:  May 15, 2012.  HISTORY OF PRESENT ILLNESS:  The patient is a 31 year old gravida 3, para 3-0-0-3, who complains of very heavy periods for the past 8 years. She misses up to 2 months, then bleeds for a month, and she is going to be admitted for a hysteroscopy D and C and HTA.  PAST MEDICAL HISTORY:  She is hypothyroid, she is on Synthroid 100 mcg. She is also on lisinopril 20 mg daily.  PAST SURGICAL HISTORY:  She had 2 C-sections and tubal ligation in the past.  SOCIAL HISTORY:  Negative.  FAMILY HISTORY:  Negative.  SYSTEM REVIEW:  Noncontributory.  PHYSICAL EXAMINATION:  GENERAL:  A well-developed female, in no distress. HEENT:  Negative. LUNGS:  Clear to P and A. HEART:  Regular rhythm.  No murmurs, no gallops. BREASTS:  Negative. ABDOMEN:  Negative. PELVIC:  Uterus normal size, negative adnexa.  Cervix clean.  Pap smear normal.  External genitalia normal. EXTREMITIES:  Negative.          ______________________________ Kathreen Cosier, M.D.     BAM/MEDQ  D:  05/07/2012  T:  05/08/2012  Job:  [id number]

## 2012-05-08 NOTE — H&P (Deleted)
  The note originally documented on this encounter has been moved the the encounter in which it belongs.  

## 2012-05-09 ENCOUNTER — Encounter (HOSPITAL_COMMUNITY): Payer: Self-pay

## 2012-05-09 ENCOUNTER — Encounter (HOSPITAL_COMMUNITY)
Admission: RE | Admit: 2012-05-09 | Discharge: 2012-05-09 | Disposition: A | Discharge status: Home / Self Care | Payer: Medicaid Other | Source: Ambulatory Visit | Attending: Obstetrics | Admitting: Obstetrics

## 2012-05-09 DIAGNOSIS — Z01818 Encounter for other preprocedural examination: Secondary | ICD-10-CM | POA: Insufficient documentation

## 2012-05-09 DIAGNOSIS — Z01812 Encounter for preprocedural laboratory examination: Secondary | ICD-10-CM | POA: Insufficient documentation

## 2012-05-09 HISTORY — DX: Anxiety disorder, unspecified: F41.9

## 2012-05-09 HISTORY — DX: Shortness of breath: R06.02

## 2012-05-09 HISTORY — DX: Personal history of other medical treatment: Z92.89

## 2012-05-09 LAB — CBC
MCV: 77.1 fL — ABNORMAL LOW (ref 78.0–100.0)
Platelets: 458 10*3/uL — ABNORMAL HIGH (ref 150–400)
RBC: 3.84 MIL/uL — ABNORMAL LOW (ref 3.87–5.11)
WBC: 8.5 10*3/uL (ref 4.0–10.5)

## 2012-05-09 LAB — BASIC METABOLIC PANEL
CO2: 25 mEq/L (ref 19–32)
Calcium: 9.5 mg/dL (ref 8.4–10.5)
GFR calc Af Amer: >90 mL/min (ref >90)
Sodium: 136 mEq/L (ref 135–145)

## 2012-05-09 NOTE — Patient Instructions (Addendum)
Your procedure is scheduled on:05/15/12  Enter through the Main Entrance at :7am Pick up desk phone and dial [phone] and inform us of your arrival.  Please call [phone] if you have any problems the morning of surgery.  Remember: Do not eat after midnight:Tuesday Do not drink after:0430 am- water only  Take these meds the morning of surgery with a sip of water: BP med and thyroid med.  DO NOT wear jewelry, eye make-up, lipstick,body lotion, or dark fingernail polish. Do not shave for 48 hours prior to surgery.  If you are to be admitted after surgery, leave suitcase in car until your room has been assigned. Patients discharged on the day of surgery will not be allowed to drive home.   Remember to use your Hibiclens as instructed.

## 2012-05-15 ENCOUNTER — Encounter (HOSPITAL_COMMUNITY): Payer: Self-pay | Admitting: Anesthesiology

## 2012-05-15 ENCOUNTER — Ambulatory Visit (HOSPITAL_COMMUNITY): Admission: RE | Admit: 2012-05-15 | Payer: Medicaid Other | Source: Ambulatory Visit | Admitting: Obstetrics

## 2012-05-15 ENCOUNTER — Encounter (HOSPITAL_COMMUNITY): Admission: RE | Payer: Self-pay | Source: Ambulatory Visit

## 2012-05-15 SURGERY — DILATATION & CURETTAGE/HYSTEROSCOPY WITH HYDROTHERMAL ABLATION
Anesthesia: Choice

## 2012-05-15 MED ORDER — LIDOCAINE HCL (CARDIAC) 20 MG/ML IV SOLN
INTRAVENOUS | Status: AC
  Filled 2012-05-15: qty 5

## 2012-05-15 MED ORDER — PROPOFOL 10 MG/ML IV EMUL
INTRAVENOUS | Status: AC
  Filled 2012-05-15: qty 20

## 2012-05-15 MED ORDER — ONDANSETRON HCL 4 MG/2ML IJ SOLN
INTRAMUSCULAR | Status: AC
  Filled 2012-05-15: qty 2

## 2012-05-15 MED ORDER — MIDAZOLAM HCL 2 MG/2ML IJ SOLN
INTRAMUSCULAR | Status: AC
  Filled 2012-05-15: qty 2

## 2012-05-15 MED ORDER — FENTANYL CITRATE 0.05 MG/ML IJ SOLN
INTRAMUSCULAR | Status: AC
  Filled 2012-05-15: qty 2

## 2012-10-04 ENCOUNTER — Other Ambulatory Visit: Payer: Self-pay | Admitting: *Deleted

## 2012-10-04 ENCOUNTER — Telehealth: Payer: Self-pay | Admitting: Family Medicine

## 2012-10-04 DIAGNOSIS — I10 Essential (primary) hypertension: Secondary | ICD-10-CM

## 2012-10-04 MED ORDER — LISINOPRIL-HYDROCHLOROTHIAZIDE 20-25 MG PO TABS: 0.5000 | ORAL_TABLET | Freq: Every day | ORAL | Status: DC

## 2012-10-04 NOTE — Telephone Encounter (Signed)
To [facility] red team - please call pt and let her know I sent in #30 of her blood pressure pills (takes 1/2 of a pill daily) but she needs to schedule an appointment to f/u on her blood pressure. Thanks!

## 2012-10-07 NOTE — Telephone Encounter (Signed)
Tried to call pt. Number is not correct. Disconnected. Lorenda Hatchet, Renato Battles

## 2012-11-07 ENCOUNTER — Other Ambulatory Visit (HOSPITAL_COMMUNITY)
Admission: RE | Admit: 2012-11-07 | Discharge: 2012-11-07 | Disposition: A | Discharge status: Home / Self Care | Payer: Medicaid Other | Source: Ambulatory Visit | Attending: Family Medicine | Admitting: Family Medicine

## 2012-11-07 ENCOUNTER — Ambulatory Visit (INDEPENDENT_AMBULATORY_CARE_PROVIDER_SITE_OTHER): Payer: Medicaid Other | Admitting: Family Medicine

## 2012-11-07 ENCOUNTER — Encounter: Payer: Self-pay | Admitting: Family Medicine

## 2012-11-07 ENCOUNTER — Ambulatory Visit: Payer: Medicaid Other | Admitting: Family Medicine

## 2012-11-07 VITALS — BP 121/81 | HR 86 | Temp 98.3°F | Ht 67.0 in | Wt 224.5 lb

## 2012-11-07 DIAGNOSIS — D509 Iron deficiency anemia, unspecified: Secondary | ICD-10-CM

## 2012-11-07 DIAGNOSIS — B9689 Other specified bacterial agents as the cause of diseases classified elsewhere: Secondary | ICD-10-CM

## 2012-11-07 DIAGNOSIS — N920 Excessive and frequent menstruation with regular cycle: Secondary | ICD-10-CM

## 2012-11-07 DIAGNOSIS — E039 Hypothyroidism, unspecified: Secondary | ICD-10-CM

## 2012-11-07 DIAGNOSIS — I10 Essential (primary) hypertension: Secondary | ICD-10-CM

## 2012-11-07 DIAGNOSIS — Z113 Encounter for screening for infections with a predominantly sexual mode of transmission: Secondary | ICD-10-CM | POA: Insufficient documentation

## 2012-11-07 DIAGNOSIS — N760 Acute vaginitis: Secondary | ICD-10-CM

## 2012-11-07 LAB — POCT WET PREP (WET MOUNT): Clue Cells Wet Prep Whiff POC: POSITIVE

## 2012-11-07 LAB — POCT HEMOGLOBIN: Hemoglobin: 9.3 g/dL — AB (ref 12.2–16.2)

## 2012-11-07 MED ORDER — LISINOPRIL-HYDROCHLOROTHIAZIDE 20-12.5 MG PO TABS: 1.0000 | ORAL_TABLET | Freq: Every day | ORAL | Status: DC

## 2012-11-07 MED ORDER — NORGESTIMATE-ETH ESTRADIOL 0.25-35 MG-MCG PO TABS: 1.0000 | ORAL_TABLET | Freq: Every day | ORAL | Status: DC

## 2012-11-07 MED ORDER — METRONIDAZOLE 500 MG PO TABS: 500.0000 mg | ORAL_TABLET | Freq: Two times a day (BID) | ORAL | Status: DC

## 2012-11-07 MED ORDER — FERROUS SULFATE 300 (60 FE) MG PO TABS: 1.0000 | ORAL_TABLET | Freq: Two times a day (BID) | ORAL | Status: DC

## 2012-11-07 NOTE — Assessment & Plan Note (Signed)
Good control.  Encouraged lifestyle modification.  Will decrease lisinopril 20-25 to 20-12.5 as frequent urination a problem for her.

## 2012-11-07 NOTE — Assessment & Plan Note (Addendum)
Not taking iron supplement.  Previously hgb less than 10.  Asymptomatic.  Will recheck today  Continues to be 9, advised to take iron daily

## 2012-11-07 NOTE — Progress Notes (Signed)
  Subjective:    Patient ID: Amanda [name], female    DOB: July 10, 1981, 32 y.o.   MRN: [id number]  HPI Here for follow-up of heavy, irregular periods.  Was last seen 7 months ago, TSH elevated at that time.  Did not have recheck since then.  Also bleeding improved while on OCP, but did not continue.  Was scheduled to see OBGYn to talk about endometrial ablation, but did not keep appointment  Reports new vaginal discharge with odor and menstrual cramping.  HYPERTENSION  BP Readings from Last 3 Encounters:  11/07/12 121/81  05/09/12 156/94  04/09/12 137/83    Hypertension ROS: taking medications as instructed, no medication side effects noted, no chest pain on exertion, no dyspnea on exertion and no swelling of ankles.       Review of Systems See HPI    Objective:   Physical Exam GEN: NAD Pelvic Exam:        External: normal female genitalia without lesions or masses        Vagina: normal without lesions or masses        Cervix: normal without lesions or masses        Adnexa: normal bimanual exam without masses or fullness        Uterus: normal by palpation        Samples for Wet prep, GC/Chlamydia obtained        Assessment & Plan:

## 2012-11-07 NOTE — Assessment & Plan Note (Signed)
Component of poorly controlled hypothyroidism.  Will recheck today along with hemoglobin.  Advised hormonal options.  Patient prefers to wait to see gynecology to discuss surgical options.  She will rechedule this appointment

## 2012-11-07 NOTE — Patient Instructions (Addendum)
Will check your hemoglobin today.  If less than 12, will need to take one iron tablet a day  Will check your thyroid- menstrual bleeding will be irregular if not on the right dose  We discussed hormonal options for controlling bleeding- you decided to follow-up with your OBGYN to discuss further

## 2012-11-07 NOTE — Assessment & Plan Note (Addendum)
Will recheck today  Update: TSH still elevated, taking it properly, will increase from 112 to 125, patient to return for recheck  in 6-8 weeks

## 2012-11-08 ENCOUNTER — Encounter: Payer: Self-pay | Admitting: Family Medicine

## 2012-11-08 MED ORDER — FERROUS SULFATE 325 (65 FE) MG PO TABS: 325.0000 mg | ORAL_TABLET | Freq: Two times a day (BID) | ORAL | Status: DC

## 2012-11-08 MED ORDER — LEVOTHYROXINE SODIUM 125 MCG PO TABS: 125.0000 ug | ORAL_TABLET | Freq: Every day | ORAL | Status: DC

## 2012-11-08 NOTE — Addendum Note (Signed)
Addended by: Macy Mis on: 11/08/2012 12:07 PM   Modules accepted: Orders

## 2012-11-08 NOTE — Addendum Note (Signed)
Addended by: Macy Mis on: 11/08/2012 10:04 AM   Modules accepted: Orders

## 2013-07-15 ENCOUNTER — Encounter: Payer: Self-pay | Admitting: Advanced Practice Midwife

## 2013-08-05 ENCOUNTER — Ambulatory Visit: Payer: Self-pay | Admitting: Advanced Practice Midwife

## 2013-11-11 ENCOUNTER — Other Ambulatory Visit: Payer: Self-pay | Admitting: Family Medicine

## 2013-12-09 ENCOUNTER — Other Ambulatory Visit: Payer: Self-pay | Admitting: Family Medicine

## 2013-12-10 ENCOUNTER — Telehealth: Payer: Self-pay | Admitting: Family Medicine

## 2013-12-10 NOTE — Telephone Encounter (Signed)
Pt called and needs a refill on her BP pills. jw

## 2013-12-10 NOTE — Telephone Encounter (Signed)
Patient needs to be seen before getting refills. Last seen here over one year ago and was given only 4 months worth of BP medications. It is not possible that she has been taking it as prescribed since then. Will not refill at this time. Please inform patient.  Amanda DodrillBrittany J McIntyre, MD

## 2013-12-10 NOTE — Telephone Encounter (Signed)
Called pt. Informed. Pt agreed. .Amanda [name]  

## 2013-12-10 NOTE — Telephone Encounter (Signed)
Will fwd to PCP for review (pt needs OV). Thanks. Arlyss Repress.Mousa Prout

## 2015-03-08 LAB — PROCEDURE REPORT - SCANNED: PAP SMEAR: NEGATIVE

## 2015-09-21 ENCOUNTER — Other Ambulatory Visit: Payer: Self-pay | Admitting: Obstetrics

## 2015-09-22 NOTE — H&P (Signed)
NAME:  Amanda, [name] NO.:  [id number]  MEDICAL RECORD NO.:  [id number]  LOCATION:                                 FACILITY:  PHYSICIAN:  Kathreen Cosier, M.D.   DATE OF BIRTH:  DATE OF ADMISSION: DATE OF DISCHARGE:                             HISTORY & PHYSICAL   HISTORY OF PRESENT ILLNESS:  The patient is a 35 year old, gravida 3, para 3-0-0-3, who has been having heavy periods lasting more than 20 days for the past 2 months and she is for hysteroscopy, D and C, and HTA.  PAST MEDICAL HISTORY:  She is hypertensive on Diovan.  She has also got thyroid disease.  She is on levothyroxine 200 mcg daily.  PAST SURGICAL HISTORY:  She had a tubal ligation in the past.  SYSTEM REVIEW:  Negative.  PHYSICAL EXAMINATION:  GENERAL:  Obese female in no distress. HEENT:  Negative. LUNGS:  Clear to P and A. HEART:  Regular rhythm.  No murmurs, no gallops. BREASTS:  Negative. ABDOMEN:  Obese.  No hepatosplenomegaly.  Uterus normal.  Negative adnexa.  EXTREMITIES:  Negative.          ______________________________ Kathreen Cosier, M.D.     BAM/MEDQ  D:  09/22/2015  T:  09/22/2015  Job:  [id number]

## 2015-09-24 ENCOUNTER — Encounter (HOSPITAL_COMMUNITY)
Admission: RE | Admit: 2015-09-24 | Discharge: 2015-09-24 | Disposition: A | Discharge status: Home / Self Care | Payer: Medicaid Other | Source: Ambulatory Visit | Attending: Obstetrics | Admitting: Obstetrics

## 2015-09-24 ENCOUNTER — Encounter (HOSPITAL_COMMUNITY): Payer: Self-pay

## 2015-09-24 DIAGNOSIS — Z029 Encounter for administrative examinations, unspecified: Secondary | ICD-10-CM | POA: Diagnosis present

## 2015-09-24 HISTORY — DX: Anemia, unspecified: D64.9

## 2015-09-24 LAB — BASIC METABOLIC PANEL
ANION GAP: 8 (ref 5–15)
BUN: 11 mg/dL (ref 6–20)
CHLORIDE: 100 mmol/L — AB (ref 101–111)
CO2: 29 mmol/L (ref 22–32)
Calcium: 10 mg/dL (ref 8.9–10.3)
Creatinine, Ser: 0.8 mg/dL (ref 0.44–1.00)
Glucose, Bld: 91 mg/dL (ref 65–99)
POTASSIUM: 3.7 mmol/L (ref 3.5–5.1)
SODIUM: 137 mmol/L (ref 135–145)

## 2015-09-24 LAB — CBC
HEMATOCRIT: 30.4 % — AB (ref 36.0–46.0)
Hemoglobin: 9.7 g/dL — ABNORMAL LOW (ref 12.0–15.0)
MCH: 25.2 pg — AB (ref 26.0–34.0)
MCHC: 31.9 g/dL (ref 30.0–36.0)
MCV: 79 fL (ref 78.0–100.0)
PLATELETS: 528 10*3/uL — AB (ref 150–400)
RBC: 3.85 MIL/uL — ABNORMAL LOW (ref 3.87–5.11)
RDW: 15.5 % (ref 11.5–15.5)
WBC: 8.9 10*3/uL (ref 4.0–10.5)

## 2015-09-24 NOTE — Patient Instructions (Signed)
Your procedure is scheduled on:09/29/15  Enter through the Main Entrance at :6am Pick up desk phone and dial [phone] and inform us of your arrival.  Please call [phone] if you have any problems the morning of surgery.  Remember: Do not eat food or drink liquids, including water, after midnight:Tuesday   You may brush your teeth the morning of surgery.  Take these meds the morning of surgery with a sip of water:Thyroid med and BP pill  DO NOT wear jewelry, eye make-up, lipstick,body lotion, or dark fingernail polish.  (Polished toes are ok) You may wear deodorant.  If you are to be admitted after surgery, leave suitcase in car until your room has been assigned. Patients discharged on the day of surgery will not be allowed to drive home. Wear loose fitting, comfortable clothes for your ride home.

## 2015-09-24 NOTE — Pre-Procedure Instructions (Signed)
Patients BP during PAT appt was 147/115, and 158/105 and 190/110- I notified Okey Regal in Dr. Elsie Stain office and she will make Dr. Gaynell Face aware.

## 2015-09-24 NOTE — Pre-Procedure Instructions (Addendum)
I requested at 1345 pm that Amanda [name] in Dr. Elsie Stain office put consent order in and she said she would but at 1640 she had not put order in. There is a signed and held order for procedure and pt signed permit for D&C, hysteroscopy and ablation but we still need order released.

## 2015-09-29 ENCOUNTER — Encounter (HOSPITAL_COMMUNITY): Admission: RE | Payer: Self-pay | Source: Ambulatory Visit

## 2015-09-29 ENCOUNTER — Ambulatory Visit (HOSPITAL_COMMUNITY): Admission: RE | Admit: 2015-09-29 | Payer: Medicaid Other | Source: Ambulatory Visit | Admitting: Obstetrics

## 2015-09-29 SURGERY — DILATATION & CURETTAGE/HYSTEROSCOPY WITH HYDROTHERMAL ABLATION
Anesthesia: General

## 2016-05-23 ENCOUNTER — Encounter: Payer: Self-pay | Admitting: *Deleted

## 2017-06-14 ENCOUNTER — Telehealth: Payer: Self-pay | Admitting: Hematology and Oncology

## 2017-06-14 ENCOUNTER — Encounter: Payer: Self-pay | Admitting: Hematology and Oncology

## 2017-06-14 NOTE — Telephone Encounter (Signed)
Appt has been scheduled for the pt to see Dr. Pamelia HoitGudena on 10/30 at 1pm. Pt aware to arrive 30 minutes early. Letter mailed.

## 2017-06-19 ENCOUNTER — Encounter: Payer: Self-pay | Admitting: Hematology

## 2017-06-26 ENCOUNTER — Telehealth: Payer: Self-pay | Admitting: Hematology and Oncology

## 2017-06-26 ENCOUNTER — Ambulatory Visit (HOSPITAL_BASED_OUTPATIENT_CLINIC_OR_DEPARTMENT_OTHER): Payer: 59 | Admitting: Hematology and Oncology

## 2017-06-26 ENCOUNTER — Encounter: Payer: Self-pay | Admitting: Hematology and Oncology

## 2017-06-26 VITALS — BP 142/87 | HR 90 | Temp 98.0°F | Resp 18 | Ht 67.5 in | Wt 265.7 lb

## 2017-06-26 DIAGNOSIS — E039 Hypothyroidism, unspecified: Secondary | ICD-10-CM

## 2017-06-26 DIAGNOSIS — I10 Essential (primary) hypertension: Secondary | ICD-10-CM

## 2017-06-26 DIAGNOSIS — D500 Iron deficiency anemia secondary to blood loss (chronic): Secondary | ICD-10-CM

## 2017-06-26 DIAGNOSIS — N939 Abnormal uterine and vaginal bleeding, unspecified: Secondary | ICD-10-CM

## 2017-06-26 NOTE — Assessment & Plan Note (Signed)
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  Currently on intrauterine device.  Previously failed progesterone injections.  Awaiting lab results from the recent office visit at her GYN. Given that she is intolerant to oral iron therapy which caused her severe constipation previously, I recommended intravenous iron therapy.  We will administered 2 doses of IV iron 1 week apart starting this Friday.  I would like to see her back in 3 months with recheck of her iron studies and follow-up.  I discussed with her the frequency of iron infusions depends upon the severity of her bleeding.

## 2017-06-26 NOTE — Progress Notes (Signed)
[facility] CONSULT NOTE  Patient Care Team: Eartha Inch, MD as PCP - General (Family Medicine)  CHIEF COMPLAINTS/PURPOSE OF CONSULTATION:  Chronic iron deficiency anemia due to heavy uterine bleeding  HISTORY OF PRESENTING ILLNESS:  Amanda [name] 36 y.o. female is here because of recent diagnosis of chronic iron deficiency anemia due to heavy uterine bleeding.  Patient has had long-standing issues with anemia.  On her most recent visit with her gynecologist, her hemoglobin was 7.2 with an MCV of 70 and RDW of 18.9.  Her platelets were 504.  This was on 05/30/2017.  She was referred to [place] for consideration for intravenous iron therapy.  Previously she had taken oral iron tablets and she could not tolerate them because of severe constipation.  She has had such heavy menstrual cycles for the past 2-3 years.  The bleeding was stopped briefly but when it resumed back she was treated with an IUD that was recently placed. She complains of severe fatigue tiredness weakness.  I reviewed her records extensively and collaborated the history with the patient.  MEDICAL HISTORY:  Past Medical History:  Diagnosis Date  . Anemia   . Anxiety 2012   h/o panic attack  . History of blood transfusion   . Hypertension   . Hypothyroidism   . Obesity (BMI 30-39.9)   . Shortness of breath    on exertion - per pt due to being overweight    SURGICAL HISTORY: Past Surgical History:  Procedure Laterality Date  . CESAREAN SECTION  2003, 2004, 2005  . TUBAL LIGATION  2005    SOCIAL HISTORY: Social History   Social History  . Marital status: Married    Spouse name: N/A  . Number of children: N/A  . Years of education: 87   Occupational History  .  [employer]   Social History Main Topics  . Smoking status: Former Smoker    Types: Cigarettes  . Smokeless tobacco: Never Used  . Alcohol use No  . Drug use: No  . Sexual activity: Yes    Birth control/ protection: Surgical    Other Topics Concern  . Not on file   Social History Narrative   lives with her three sons borh '03, '04, '05.  Unemplyed - watches her kids.  HS education.  no tob, etoh, drugs.  watches movies, goes out with friends for fun.  does not exercise.  wears seatbelt, doesn't wear sunscreen.  christian.    FAMILY HISTORY: Family History  Problem Relation Age of Onset  . Diabetes Mother   . Hypertension Mother   . Diabetes Maternal Uncle   . Hypertension Maternal Grandmother     ALLERGIES:  has No Known Allergies.  MEDICATIONS:  Current Outpatient Prescriptions  Medication Sig Dispense Refill  . levothyroxine (SYNTHROID, LEVOTHROID) 200 MCG tablet Take 200 mcg by mouth daily before breakfast.    . naproxen sodium (ALEVE) 220 MG tablet Take 440 mg by mouth 2 (two) times daily as needed (For headache and pain.).    Marland Kitchen norgestimate-ethinyl estradiol (SPRINTEC 28) 0.25-35 MG-MCG tablet Take 1 tablet by mouth daily.    . valsartan-hydrochlorothiazide (DIOVAN-HCT) 320-25 MG tablet Take 1 tablet by mouth daily.     No current facility-administered medications for this visit.     REVIEW OF SYSTEMS:   Constitutional: Denies fevers, chills or abnormal night sweats, fatigue Eyes: Denies blurriness of vision, double vision or watery eyes Ears, nose, mouth, throat, and face: Denies mucositis or  sore throat Respiratory: Denies cough, dyspnea or wheezes Cardiovascular: Denies palpitation, chest discomfort or lower extremity swelling Gastrointestinal:  Denies nausea, heartburn or change in bowel habits Skin: Denies abnormal skin rashes Lymphatics: Denies new lymphadenopathy or easy bruising Neurological:Denies numbness, tingling or new weaknesses Behavioral/Psych: Mood is stable, no new changes  Breast:  Denies any palpable lumps or discharge All other systems were reviewed with the patient and are negative.  PHYSICAL EXAMINATION: ECOG PERFORMANCE STATUS: 1 - Symptomatic but completely  ambulatory  Vitals:   06/26/17 1311  BP: (!) 142/87  Pulse: 90  Resp: 18  Temp: 98 F (36.7 C)  SpO2: 100%   Filed Weights   06/26/17 1311  Weight: 265 lb 11.2 oz (120.5 kg)    GENERAL:alert, no distress and comfortable SKIN: skin color, texture, turgor are normal, no rashes or significant lesions EYES: normal, conjunctiva are pink and non-injected, sclera clear OROPHARYNX:no exudate, no erythema and lips, buccal mucosa, and tongue normal  NECK: supple, thyroid normal size, non-tender, without nodularity LYMPH:  no palpable lymphadenopathy in the cervical, axillary or inguinal LUNGS: clear to auscultation and percussion with normal breathing effort HEART: regular rate & rhythm and no murmurs and no lower extremity edema ABDOMEN:abdomen soft, non-tender and normal bowel sounds Musculoskeletal:no cyanosis of digits and no clubbing  PSYCH: alert & oriented x 3 with fluent speech NEURO: no focal motor/sensory deficits   LABORATORY DATA:  I have reviewed the data as listed Lab Results  Component Value Date   WBC 8.9 09/24/2015   HGB 9.7 (L) 09/24/2015   HCT 30.4 (L) 09/24/2015   MCV 79.0 09/24/2015   PLT 528 (H) 09/24/2015   Lab Results  Component Value Date   NA 137 09/24/2015   K 3.7 09/24/2015   CL 100 (L) 09/24/2015   CO2 29 09/24/2015   Lab studies done on 05/30/2017: Hemoglobin 7.2, MCV 70, RDW 18.9, platelets 504, WBC normal at 9.1 Iron studies were not done  RADIOGRAPHIC STUDIES: I have personally reviewed the radiological reports and agreed with the findings in the report.  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  Currently on intrauterine device.  Previously failed progesterone injections.  Awaiting lab results from the recent office visit at her GYN. Given that she is intolerant to oral iron therapy  which caused her severe constipation previously, I recommended intravenous iron therapy.  We will administered 2 doses of IV iron 1 week apart starting this Friday.  I would like to see her back in 3 months with recheck of her iron studies and follow-up.  I discussed with her the frequency of iron infusions depends upon the severity of her bleeding.   All questions were answered. The patient knows to call the clinic with any problems, questions or concerns.    Sabas SousGudena, Shar Paez K, MD 06/26/17

## 2017-06-26 NOTE — Telephone Encounter (Signed)
Scheduled appt per 10/30 los. Unable to schedule Iron just yet - emailing Cindy regarding added it due to them being cap days.

## 2017-06-27 ENCOUNTER — Telehealth: Payer: Self-pay | Admitting: Hematology and Oncology

## 2017-06-27 NOTE — Telephone Encounter (Signed)
Left voicemail for patient regarding appt added per 10/30 los.

## 2017-06-29 ENCOUNTER — Other Ambulatory Visit: Payer: 59

## 2017-06-29 ENCOUNTER — Ambulatory Visit: Payer: 59

## 2017-07-06 ENCOUNTER — Other Ambulatory Visit: Payer: 59

## 2017-07-06 ENCOUNTER — Ambulatory Visit: Payer: 59

## 2017-09-02 ENCOUNTER — Ambulatory Visit (HOSPITAL_COMMUNITY)
Admission: EM | Admit: 2017-09-02 | Discharge: 2017-09-02 | Disposition: A | Discharge status: Home / Self Care | Payer: 59

## 2017-09-02 ENCOUNTER — Encounter (HOSPITAL_COMMUNITY): Payer: Self-pay | Admitting: Emergency Medicine

## 2017-09-02 DIAGNOSIS — H6502 Acute serous otitis media, left ear: Secondary | ICD-10-CM

## 2017-09-02 MED ORDER — IBUPROFEN 600 MG PO TABS: 600.0000 mg | ORAL_TABLET | Freq: Four times a day (QID) | ORAL | 0 refills | Status: DC | PRN

## 2017-09-02 MED ORDER — ACETAMINOPHEN 325 MG PO TABS
ORAL_TABLET | ORAL | Status: AC
Start: 2017-09-02 — End: 2017-09-02
  Filled 2017-09-02: qty 3

## 2017-09-02 MED ORDER — ACETAMINOPHEN 500 MG PO TABS: 1000.0000 mg | ORAL_TABLET | Freq: Three times a day (TID) | ORAL | 99 refills | Status: AC | PRN

## 2017-09-02 MED ORDER — ACETAMINOPHEN 325 MG PO TABS
975.0000 mg | ORAL_TABLET | Freq: Once | ORAL | Status: AC
  Administered 2017-09-02: 975 mg via ORAL

## 2017-09-02 MED ORDER — TRAMADOL HCL 50 MG PO TABS: 50.0000 mg | ORAL_TABLET | Freq: Four times a day (QID) | ORAL | 0 refills | Status: AC | PRN

## 2017-09-02 MED ORDER — AZITHROMYCIN 250 MG PO TABS: ORAL_TABLET | ORAL | 0 refills | Status: DC

## 2017-09-02 MED ORDER — KETOROLAC TROMETHAMINE 60 MG/2ML IM SOLN
INTRAMUSCULAR | Status: AC
  Filled 2017-09-02: qty 2

## 2017-09-02 MED ORDER — KETOROLAC TROMETHAMINE 60 MG/2ML IM SOLN: 60.0000 mg | Freq: Once | INTRAMUSCULAR | Status: DC

## 2017-09-02 MED ORDER — AZITHROMYCIN 250 MG PO TABS: ORAL_TABLET | ORAL | 0 refills | Status: AC

## 2017-09-02 NOTE — ED Provider Notes (Signed)
09/02/2017 6:55 PM   DOB: [birth year] / MRN: [id number]  SUBJECTIVE:  Amanda [name] is a 37 y.o. female presenting for left sided ear pain that started about 4 days ago.  Was started on Amox 2 days ago and tells me that she is only getting worse.  She describes the ear pain as a pressure sensation and tells me that the right side is now starting to bother her.  She is taking her BP medication as prescribed.  She has No Known Allergies.   She  has a past medical history of Anemia, Anxiety (2012), History of blood transfusion, Hypertension, Hypothyroidism, Obesity (BMI 30-39.9), and Shortness of breath.    She  reports that she has quit smoking. Her smoking use included cigarettes. she has never used smokeless tobacco. She reports that she does not drink alcohol or use drugs. She  reports that she currently engages in sexual activity. She reports using the following method of birth control/protection: Surgical. The patient  has a past surgical history that includes Tubal ligation (2005) and Cesarean section (2003, 2004, 2005).  Her family history includes Diabetes in her maternal uncle and mother; Hypertension in her maternal grandmother and mother.  Review of Systems  Constitutional: Negative for fever.  HENT: Positive for congestion and ear pain.   Eyes: Negative.   Respiratory: Negative for cough and shortness of breath.   Skin: Negative for itching and rash.  Neurological: Negative for dizziness and headaches.    OBJECTIVE:  BP (!) 178/85 (BP Location: Left Arm)   Pulse 85   Temp 99.3 F (37.4 C) (Oral)   Resp 20   LMP 09/02/2017   SpO2 99%    Lab Results  Component Value Date   CREATININE 0.80 09/24/2015    Physical Exam  Constitutional: She is active.  Non-toxic appearance.  HENT:  Right Ear: Hearing, tympanic membrane, external ear and ear canal normal.  Left Ear: Hearing, tympanic membrane, external ear and ear canal normal.  Nose: Nose normal. Right sinus exhibits no  maxillary sinus tenderness and no frontal sinus tenderness. Left sinus exhibits no maxillary sinus tenderness and no frontal sinus tenderness.  Mouth/Throat: Uvula is midline, oropharynx is clear and moist and mucous membranes are normal. Mucous membranes are not dry. No oropharyngeal exudate, posterior oropharyngeal edema or tonsillar abscesses.  Cardiovascular: Normal rate, regular rhythm, S1 normal, S2 normal, normal heart sounds and intact distal pulses. Exam reveals no gallop, no friction rub and no decreased pulses.  No murmur heard. Pulmonary/Chest: Effort normal. No stridor. No tachypnea. No respiratory distress. She has no wheezes. She has no rales.  Abdominal: She exhibits no distension.  Musculoskeletal: She exhibits no edema.  Lymphadenopathy:       Head (right side): No submandibular and no tonsillar adenopathy present.       Head (left side): No submandibular and no tonsillar adenopathy present.    She has no cervical adenopathy.  Neurological: She is alert.  Skin: Skin is warm and dry. She is not diaphoretic. No pallor.    No results found for this or any previous visit (from the past 72 hour(s)).  No results found.  ASSESSMENT AND PLAN:  No orders of the defined types were placed in this encounter.    Acute serous otitis media of left ear, recurrence not specified: Possibly due to an atypical organism.  Will start zpack, along with tramadol and tylenol as needed for pain control.       The patient is advised  to call or return to clinic if she does not see an improvement in symptoms, or to seek the care of the closest emergency department if she worsens with the above plan.   Deliah BostonMichael Rosmary Dionisio, MHS, PA-C 09/02/2017 6:55 PM    Ofilia Neaslark, Raina Sole L, PA-C 09/02/17 1859

## 2017-09-02 NOTE — Discharge Instructions (Signed)
Stop the amoxicillin and start the azithromycin.  Take the pain medications as I have prescribed.  Come back in 48 hours if you are not improving. Please go back to your primary care doctor regarding you blood pressure.

## 2017-09-02 NOTE — ED Triage Notes (Signed)
PT C/O: left ear pain onset 3 days... PCP gave her Amox w/no relief.  Sx also include runny nose, nasal congestion x1 week  DENIES: fevers  TAKING MEDS: Amox  A&O x4... NAD... Ambulatory

## 2017-09-03 ENCOUNTER — Other Ambulatory Visit: Payer: 59

## 2017-09-03 NOTE — Assessment & Plan Note (Deleted)
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  Currently on intrauterine device.  Previously failed progesterone injections.  Given that she is intolerant to oral iron therapy which caused her severe constipation previously  I would like to see her back in 3 months with recheck of her iron studies and follow-up.  I discussed with her the frequency of iron infusions depends upon the severity of her bleeding.

## 2017-09-04 ENCOUNTER — Ambulatory Visit: Payer: 59 | Admitting: Hematology and Oncology

## 2018-03-10 ENCOUNTER — Other Ambulatory Visit: Payer: Self-pay | Admitting: Physician Assistant

## 2024-03-19 ENCOUNTER — Other Ambulatory Visit: Payer: Self-pay | Admitting: Medical Genetics

## 2024-06-03 ENCOUNTER — Encounter: Payer: Self-pay | Admitting: Hematology and Oncology

## 2024-06-06 ENCOUNTER — Other Ambulatory Visit: Payer: Self-pay | Admitting: *Deleted

## 2024-06-06 DIAGNOSIS — Z006 Encounter for examination for normal comparison and control in clinical research program: Secondary | ICD-10-CM

## 2024-07-07 LAB — GENECONNECT MOLECULAR SCREEN

## 2024-07-08 ENCOUNTER — Encounter: Payer: Self-pay | Admitting: Hematology and Oncology

## 2024-07-08 ENCOUNTER — Telehealth: Payer: Self-pay | Admitting: Medical Genetics

## 2024-07-08 ENCOUNTER — Other Ambulatory Visit: Payer: Self-pay | Admitting: Medical Genetics

## 2024-07-08 DIAGNOSIS — Z006 Encounter for examination for normal comparison and control in clinical research program: Secondary | ICD-10-CM

## 2024-07-08 NOTE — Telephone Encounter (Signed)
 [facility]  07/08/2024  2:28 PM  Confirmed I was speaking with Amanda [name] [phone] by using name and DOB. Informed participant the reason for this call is to follow-up on a recent sample the participant provided at one of the [facility] lab locations. Informed participant the test was not able to be completed with this sample and apologized for the inconvenience. Participant was requested to provide a new sample at one of our participating labs at no cost so that participant can continue participation and receive test results. Informed participant they do not need to be fasting and if there are other samples that need to be drawn, they can be done at the same visit. Participant has not had a blood transfusion or blood product in the last 30 days. Participant agreed to provide another sample. Participant was provided the [employer] program website to learn why this may have happened. Participant was thanked for their time and continued support of the above study.    Jordyn Pennstrom, BS   [facility] Clinical Research Specialist II Direct Dial: [phone]  Fax: [phone]

## 2024-07-09 ENCOUNTER — Other Ambulatory Visit

## 2024-07-09 DIAGNOSIS — Z006 Encounter for examination for normal comparison and control in clinical research program: Secondary | ICD-10-CM

## 2024-07-18 LAB — GENECONNECT MOLECULAR SCREEN: Genetic Analysis Overall Interpretation: NEGATIVE
